# Patient Record
Sex: Female | Born: 1940 | Race: White | Hispanic: No | Smoking: Never smoker
Health system: Southern US, Community
[De-identification: ages and names within clinical notes are randomized; demographics above are authoritative.]

## PROBLEM LIST (undated history)

## (undated) DIAGNOSIS — K224 Dyskinesia of esophagus: Secondary | ICD-10-CM

## (undated) DIAGNOSIS — L4059 Other psoriatic arthropathy: Secondary | ICD-10-CM

## (undated) DIAGNOSIS — Z972 Presence of dental prosthetic device (complete) (partial): Secondary | ICD-10-CM

## (undated) DIAGNOSIS — N189 Chronic kidney disease, unspecified: Secondary | ICD-10-CM

## (undated) DIAGNOSIS — M1990 Unspecified osteoarthritis, unspecified site: Secondary | ICD-10-CM

## (undated) DIAGNOSIS — L409 Psoriasis, unspecified: Secondary | ICD-10-CM

## (undated) DIAGNOSIS — T7840XA Allergy, unspecified, initial encounter: Secondary | ICD-10-CM

## (undated) DIAGNOSIS — M5020 Other cervical disc displacement, unspecified cervical region: Secondary | ICD-10-CM

## (undated) DIAGNOSIS — J45909 Unspecified asthma, uncomplicated: Secondary | ICD-10-CM

## (undated) DIAGNOSIS — F419 Anxiety disorder, unspecified: Secondary | ICD-10-CM

## (undated) DIAGNOSIS — G2581 Restless legs syndrome: Secondary | ICD-10-CM

## (undated) DIAGNOSIS — G709 Myoneural disorder, unspecified: Secondary | ICD-10-CM

## (undated) DIAGNOSIS — K219 Gastro-esophageal reflux disease without esophagitis: Secondary | ICD-10-CM

## (undated) DIAGNOSIS — I10 Essential (primary) hypertension: Secondary | ICD-10-CM

## (undated) HISTORY — DX: Myoneural disorder, unspecified: G70.9

## (undated) HISTORY — DX: Anxiety disorder, unspecified: F41.9

## (undated) HISTORY — PX: TUBAL LIGATION: SHX77

## (undated) HISTORY — DX: Chronic kidney disease, unspecified: N18.9

## (undated) HISTORY — DX: Psoriasis, unspecified: L40.9

## (undated) HISTORY — PX: CATARACT EXTRACTION: SUR2

## (undated) HISTORY — DX: Gastro-esophageal reflux disease without esophagitis: K21.9

## (undated) HISTORY — DX: Other cervical disc displacement, unspecified cervical region: M50.20

## (undated) HISTORY — PX: KNEE SURGERY: SHX244

## (undated) HISTORY — DX: Unspecified osteoarthritis, unspecified site: M19.90

## (undated) HISTORY — PX: ABDOMINAL HYSTERECTOMY: SHX81

## (undated) HISTORY — DX: Unspecified asthma, uncomplicated: J45.909

## (undated) HISTORY — DX: Allergy, unspecified, initial encounter: T78.40XA

## (undated) HISTORY — PX: CHOLECYSTECTOMY: SHX55

## (undated) HISTORY — PX: TONSILLECTOMY: SUR1361

---

## 1998-12-27 ENCOUNTER — Emergency Department (HOSPITAL_COMMUNITY): Admission: EM | Admit: 1998-12-27 | Discharge: 1998-12-27 | Payer: Self-pay | Admitting: Emergency Medicine

## 1998-12-27 ENCOUNTER — Encounter: Payer: Self-pay | Admitting: Emergency Medicine

## 1999-09-04 ENCOUNTER — Other Ambulatory Visit: Admission: RE | Admit: 1999-09-04 | Discharge: 1999-09-04 | Payer: Self-pay | Admitting: Gynecology

## 1999-12-04 ENCOUNTER — Encounter: Payer: Self-pay | Admitting: Surgery

## 1999-12-08 ENCOUNTER — Encounter: Payer: Self-pay | Admitting: Surgery

## 1999-12-08 ENCOUNTER — Ambulatory Visit (HOSPITAL_COMMUNITY): Admission: RE | Admit: 1999-12-08 | Discharge: 1999-12-09 | Payer: Self-pay | Admitting: Surgery

## 1999-12-08 ENCOUNTER — Encounter (INDEPENDENT_AMBULATORY_CARE_PROVIDER_SITE_OTHER): Payer: Self-pay | Admitting: Specialist

## 2000-09-26 ENCOUNTER — Other Ambulatory Visit: Admission: RE | Admit: 2000-09-26 | Discharge: 2000-09-26 | Payer: Self-pay | Admitting: Gynecology

## 2000-10-12 ENCOUNTER — Other Ambulatory Visit: Admission: RE | Admit: 2000-10-12 | Discharge: 2000-10-12 | Payer: Self-pay | Admitting: Gynecology

## 2000-10-12 ENCOUNTER — Encounter (INDEPENDENT_AMBULATORY_CARE_PROVIDER_SITE_OTHER): Payer: Self-pay

## 2000-10-13 ENCOUNTER — Encounter: Payer: Self-pay | Admitting: Gynecology

## 2000-10-13 ENCOUNTER — Ambulatory Visit (HOSPITAL_COMMUNITY): Admission: RE | Admit: 2000-10-13 | Discharge: 2000-10-13 | Payer: Self-pay | Admitting: Gynecology

## 2000-11-10 ENCOUNTER — Encounter: Payer: Self-pay | Admitting: Gynecology

## 2000-11-15 ENCOUNTER — Encounter (INDEPENDENT_AMBULATORY_CARE_PROVIDER_SITE_OTHER): Payer: Self-pay

## 2000-11-15 ENCOUNTER — Inpatient Hospital Stay (HOSPITAL_COMMUNITY): Admission: RE | Admit: 2000-11-15 | Discharge: 2000-11-18 | Payer: Self-pay | Admitting: Gynecology

## 2001-09-28 ENCOUNTER — Other Ambulatory Visit: Admission: RE | Admit: 2001-09-28 | Discharge: 2001-09-28 | Payer: Self-pay | Admitting: Gynecology

## 2002-10-09 ENCOUNTER — Other Ambulatory Visit: Admission: RE | Admit: 2002-10-09 | Discharge: 2002-10-09 | Payer: Self-pay | Admitting: Gynecology

## 2003-01-01 ENCOUNTER — Encounter: Payer: Self-pay | Admitting: Rheumatology

## 2003-01-01 ENCOUNTER — Encounter: Admission: RE | Admit: 2003-01-01 | Discharge: 2003-01-01 | Payer: Self-pay | Admitting: Rheumatology

## 2003-11-11 ENCOUNTER — Other Ambulatory Visit: Admission: RE | Admit: 2003-11-11 | Discharge: 2003-11-11 | Payer: Self-pay | Admitting: Gynecology

## 2004-11-12 ENCOUNTER — Other Ambulatory Visit: Admission: RE | Admit: 2004-11-12 | Discharge: 2004-11-12 | Payer: Self-pay | Admitting: Gynecology

## 2005-11-02 ENCOUNTER — Emergency Department (HOSPITAL_COMMUNITY): Admission: EM | Admit: 2005-11-02 | Discharge: 2005-11-02 | Payer: Self-pay | Admitting: Emergency Medicine

## 2006-02-02 ENCOUNTER — Other Ambulatory Visit: Admission: RE | Admit: 2006-02-02 | Discharge: 2006-02-02 | Payer: Self-pay | Admitting: Gynecology

## 2006-03-18 ENCOUNTER — Ambulatory Visit (HOSPITAL_COMMUNITY): Admission: RE | Admit: 2006-03-18 | Discharge: 2006-03-18 | Payer: Self-pay | Admitting: Rheumatology

## 2006-03-18 ENCOUNTER — Encounter (INDEPENDENT_AMBULATORY_CARE_PROVIDER_SITE_OTHER): Payer: Self-pay | Admitting: Specialist

## 2006-11-14 ENCOUNTER — Other Ambulatory Visit: Admission: RE | Admit: 2006-11-14 | Discharge: 2006-11-14 | Payer: Self-pay | Admitting: Gynecology

## 2007-05-08 ENCOUNTER — Emergency Department (HOSPITAL_COMMUNITY): Admission: EM | Admit: 2007-05-08 | Discharge: 2007-05-08 | Payer: Self-pay | Admitting: Family Medicine

## 2008-05-15 ENCOUNTER — Other Ambulatory Visit: Admission: RE | Admit: 2008-05-15 | Discharge: 2008-05-15 | Payer: Self-pay | Admitting: Gynecology

## 2008-05-18 ENCOUNTER — Emergency Department (HOSPITAL_COMMUNITY): Admission: EM | Admit: 2008-05-18 | Discharge: 2008-05-18 | Payer: Self-pay | Admitting: Family Medicine

## 2008-05-25 ENCOUNTER — Emergency Department (HOSPITAL_COMMUNITY): Admission: EM | Admit: 2008-05-25 | Discharge: 2008-05-25 | Payer: Self-pay | Admitting: Emergency Medicine

## 2009-01-16 ENCOUNTER — Encounter: Admission: RE | Admit: 2009-01-16 | Discharge: 2009-01-16 | Payer: Self-pay | Admitting: Internal Medicine

## 2010-02-17 ENCOUNTER — Encounter: Admission: RE | Admit: 2010-02-17 | Discharge: 2010-02-17 | Payer: Self-pay | Admitting: Internal Medicine

## 2011-04-16 ENCOUNTER — Other Ambulatory Visit (HOSPITAL_COMMUNITY): Payer: Self-pay | Admitting: Rheumatology

## 2011-04-16 DIAGNOSIS — R7989 Other specified abnormal findings of blood chemistry: Secondary | ICD-10-CM

## 2011-04-28 ENCOUNTER — Ambulatory Visit (HOSPITAL_COMMUNITY)
Admission: RE | Admit: 2011-04-28 | Discharge: 2011-04-28 | Disposition: A | Discharge status: Home / Self Care | Payer: Medicare Other | Source: Ambulatory Visit | Attending: Rheumatology | Admitting: Rheumatology

## 2011-04-28 ENCOUNTER — Other Ambulatory Visit: Payer: Self-pay | Admitting: Interventional Radiology

## 2011-04-28 DIAGNOSIS — R7989 Other specified abnormal findings of blood chemistry: Secondary | ICD-10-CM

## 2011-04-28 DIAGNOSIS — L4050 Arthropathic psoriasis, unspecified: Secondary | ICD-10-CM | POA: Insufficient documentation

## 2011-04-28 DIAGNOSIS — Z79899 Other long term (current) drug therapy: Secondary | ICD-10-CM | POA: Insufficient documentation

## 2011-04-28 DIAGNOSIS — K769 Liver disease, unspecified: Secondary | ICD-10-CM | POA: Insufficient documentation

## 2011-04-28 LAB — CBC
HCT: 38.5 % (ref 36.0–46.0)
Hemoglobin: 13.6 g/dL (ref 12.0–15.0)
RDW: 13.5 % (ref 11.5–15.5)
WBC: 7.5 10*3/uL (ref 4.0–10.5)

## 2011-04-28 LAB — PROTIME-INR
INR: 0.92 (ref 0.00–1.49)
Prothrombin Time: 12.6 seconds (ref 11.6–15.2)

## 2011-04-28 LAB — APTT: aPTT: 26 seconds (ref 24–37)

## 2011-05-14 NOTE — Op Note (Signed)
[facility]  Patient:    Heidi Davenport, Heidi Davenport                      MRN: [id number] Proc. Date: 11/15/00 Adm. Date:  [id number] Attending:  Douglass Rivers                           Operative Report  PREOPERATIVE DIAGNOSES: 1. Postmenopausal bleeding. 2. Endocervical polyp. 3. Right ovarian cyst with excrescence.  POSTOPERATIVE DIAGNOSES: 1. Postmenopausal bleeding. 2. Endocervical polyp. 3. Right ovarian cyst with excrescence. 4. Benign ovarian cyst.  PROCEDURE:  Total abdominal hysterectomy, bilateral salpingo-oophorectomy, pelvic washings, and a frozen section.  SURGEON:  Douglass Rivers, M.D.  ASSISTANT:  Gaetano Hawthorne. Lily Peer, M.D.  ANESTHESIA:  General.  IV FLUIDS:  1700 cc lactated Ringers.  ESTIMATED BLOOD LOSS:  Less than 100 cc.  URINE OUTPUT:  125 cc.  FINDINGS:  Normal uterus, left ovary, tube with evidence of previous tubal ligation.  The right ovary had a small, firm cyst that was benign by frozen. There was a cervical polyp.  Pathology was uterus, tubes, ovaries, and cervix.  COMPLICATIONS:  None.  DESCRIPTION OF PROCEDURE:  The patient was taken to the operating room and placed in the supine position.  General anesthesia was induced and prepped and draped in the usual sterile fashion.  A Pfannenstiel skin incision was made with the scalpel and carried through the underlying layer, fascia with the second.  With electrocautery, the fascia was scored in the midline and incision was extended laterally with Mayo scissors.  The inferior aspect of the fascial incision was grasped with Kochers.  Underlying rectus muscles were dissected off by blunt and sharp dissection.  In a similar fashion, the superior aspect of the fascial incision was grasped with Kochers; the underlying rectus muscles were dissected off.  The rectus muscles were separated in the midline.  The peritoneum was identified and entered bluntly. The peritoneal incision was  then extended superiorly and inferiorly with good visualization of the underlying bowel and bladder.  The pelvic washings were then obtained from the pelvis and gutters and well be sent for permanent section with heparin.  The pelvis was then inspected, and the ovarian cyst was palpated.  The OConnor-OSullivan retractor was placed in the incision.  The bowels were packed away with moist lap packs x 4.  The cornua were grasped with Kelly clamps.  The round ligament was transected and suture ligated with 0 Vicryl bilaterally.  The anterior leaf of the broad ligament was incised, and the bladder flap was created sharply.  The posterior leaf was also incised.  The ureters were identified bilaterally.  The infundibulopelvic ligament was transected and suture ligated.  The right ovary was sharply dissected off and sent out for frozen section.  The uterine vessels were then skeletonized, clamped, and doubly suture ligated with 0 Vicryl.  The cardinal ligaments and the uterosacral ligaments were also taken down sharply, transected and suture ligated with 0 Vicryl, all with careful attention to the bladder anteriorly.  The angles were cross-clamped.  The caliber of the vagina was narrow, and two Heaneys completely obliterated the vaginal opening.  The angles were transfixed to the ipsilateral cardinal ligaments bilaterally. There was no bleeding noted at the remainder of the cuff which was essentially closed after the two angle stitches were placed.  The pelvis was irrigated with copious amounts of warm saline.  Inspection underneath the  bladder, the round ligament, and infundibulopelvic ligaments confirmed hemostasis.  The retractor and lap packs were removed.  The bowel was noted to be pushing through the incision; therefore, the peritoneum was identified and closed in a running fashion with 0 Vicryl.  The muscles were inspected and noted to be hemostatic.  The fascia was closed with 0 Vicryl  starting at the apex and moving towards the midline bilaterally.  The subcutaneous was irrigated; the skin closed with staples.  The patient tolerated the procedure well.  Sponge, needle and instrument counts correct x 2.  She was extubated in the OR and transferred to the PACU in stable condition. DD:  11/15/00 TD:  11/16/00 Job: [id number]

## 2011-05-14 NOTE — Discharge Summary (Signed)
[facility]  Patient:    Heidi Davenport, Heidi Davenport                      MRN: [id number] Adm. Date:  [id number] Disc. Date: [id number] Attending:  Douglass Rivers                           Discharge Summary  PRINCIPAL DIAGNOSIS:  Postmenopausal bleeding --  ovarian cyst with excrescence in a postmenopausal woman.  PRINCIPAL PROCEDURE:  Total abdominal hysterectomy and bilateral salpingo-oophorectomy with a frozen section.  HISTORY OF PRESENT ILLNESS:  Please refer to the dictated H&P, but briefly, patient is a 70 year old G3, P3 who had complained of postmenopausal bleeding on the CombiPatch.  She was noted during her exam to have a polypoid mass in her cervix.  She presented back for a sonohysterogram in order to delineate the polypoid mass and during which time she was noted to have a 9 x 9 x 10-mm cyst on the right ovary that was seen with an excrescence in the inferior portion of the lumen.  There was no evidence of fluid in the cul-de-sac. CA125 was normal.  CT scan was negative.  HOSPITAL COURSE:  The patient presented on the afternoon of November 15, 2000 and underwent total abdominal hysterectomy and bilateral salpingo-oophorectomy, pelvic washings and a frozen section under general anesthesia.  The operative findings were normal uterus, left ovary and bilateral salpingectomies.  The right ovary had a small cyst.  The final pathology came back consistent with a benign serous cystadenoma, focal adenomyosis, benign endometrial polyp, benign proliferative endometrium.  The patient was extubated in the OR and transferred to the PACU and then up to the postoperative floor in due fashion.  Her postoperative course was unremarkable.  Her potassium had been low preoperatively and had been corrected.  It was rechecked postoperatively and remained normal at 3.6.  Her postoperative hemoglobin and hematocrit were 11.8 and 33.2 with platelets of 215,000.  By  postoperative day #2, the patient had passed gas and was tolerating a regular diet, she was ambulating without difficulty and was without complaints.  Pain was well-controlled.  She was discharged home on postop day #3.  Her exam was unremarkable.  Her staples were removed and Steri-Strips were placed and her incision was noted to be intact.  DISCHARGE MEDICATIONS:  She was discharged home with her preoperative medications.  She will be switched to an Estraderm patch from CombiPatch.  She has been given prescriptions for Tylox preoperatively and will use them as needed.  FOLLOWUP:  She will follow up in the office in two weeks. DD:  11/18/00 TD:  11/19/00 Job: [id number]

## 2011-07-16 ENCOUNTER — Emergency Department (HOSPITAL_COMMUNITY): Payer: Medicare Other

## 2011-07-16 ENCOUNTER — Ambulatory Visit
Admission: RE | Admit: 2011-07-16 | Discharge: 2011-07-16 | Disposition: A | Discharge status: Home / Self Care | Payer: Medicare Other | Source: Ambulatory Visit | Attending: Rheumatology | Admitting: Rheumatology

## 2011-07-16 ENCOUNTER — Emergency Department (HOSPITAL_COMMUNITY)
Admission: EM | Admit: 2011-07-16 | Discharge: 2011-07-17 | Disposition: A | Discharge status: Home / Self Care | Payer: Medicare Other | Attending: Emergency Medicine | Admitting: Emergency Medicine

## 2011-07-16 ENCOUNTER — Other Ambulatory Visit: Payer: Self-pay | Admitting: Rheumatology

## 2011-07-16 DIAGNOSIS — M549 Dorsalgia, unspecified: Secondary | ICD-10-CM

## 2011-07-16 DIAGNOSIS — R0609 Other forms of dyspnea: Secondary | ICD-10-CM | POA: Insufficient documentation

## 2011-07-16 DIAGNOSIS — R0989 Other specified symptoms and signs involving the circulatory and respiratory systems: Secondary | ICD-10-CM | POA: Insufficient documentation

## 2011-07-16 DIAGNOSIS — R0602 Shortness of breath: Secondary | ICD-10-CM | POA: Insufficient documentation

## 2011-07-16 DIAGNOSIS — R0789 Other chest pain: Secondary | ICD-10-CM | POA: Insufficient documentation

## 2011-07-16 DIAGNOSIS — R7309 Other abnormal glucose: Secondary | ICD-10-CM | POA: Insufficient documentation

## 2011-07-16 DIAGNOSIS — I10 Essential (primary) hypertension: Secondary | ICD-10-CM | POA: Insufficient documentation

## 2011-07-16 DIAGNOSIS — K219 Gastro-esophageal reflux disease without esophagitis: Secondary | ICD-10-CM | POA: Insufficient documentation

## 2011-07-16 HISTORY — DX: Essential (primary) hypertension: I10

## 2011-07-16 LAB — DIFFERENTIAL
Basophils Absolute: 0.1 10*3/uL (ref 0.0–0.1)
Basophils Relative: 1 % (ref 0–1)
Eosinophils Relative: 2 % (ref 0–5)
Lymphocytes Relative: 28 % (ref 12–46)
Monocytes Absolute: 1.1 10*3/uL — ABNORMAL HIGH (ref 0.1–1.0)

## 2011-07-16 LAB — POCT I-STAT, CHEM 8
Calcium, Ion: 1.14 mmol/L (ref 1.12–1.32)
Chloride: 102 mEq/L (ref 96–112)
HCT: 40 % (ref 36.0–46.0)
Hemoglobin: 13.6 g/dL (ref 12.0–15.0)
Potassium: 3.7 mEq/L (ref 3.5–5.1)

## 2011-07-16 LAB — CBC
HCT: 38 % (ref 36.0–46.0)
MCHC: 35.5 g/dL (ref 30.0–36.0)
Platelets: 245 10*3/uL (ref 150–400)
RDW: 13.2 % (ref 11.5–15.5)
WBC: 9.9 10*3/uL (ref 4.0–10.5)

## 2011-07-16 LAB — TROPONIN I: Troponin I: <0.3 ng/mL (ref <0.30)

## 2011-07-17 ENCOUNTER — Encounter (HOSPITAL_COMMUNITY): Payer: Self-pay | Admitting: Radiology

## 2011-07-17 MED ORDER — IOHEXOL 300 MG/ML  SOLN
100.0000 mL | Freq: Once | INTRAMUSCULAR | Status: AC | PRN
  Administered 2011-07-16: 100 mL via INTRAVENOUS

## 2012-04-20 ENCOUNTER — Other Ambulatory Visit: Payer: Self-pay | Admitting: Internal Medicine

## 2012-04-20 DIAGNOSIS — Z1231 Encounter for screening mammogram for malignant neoplasm of breast: Secondary | ICD-10-CM

## 2012-05-01 ENCOUNTER — Ambulatory Visit
Admission: RE | Admit: 2012-05-01 | Discharge: 2012-05-01 | Disposition: A | Discharge status: Home / Self Care | Payer: Medicare Other | Source: Ambulatory Visit | Attending: Internal Medicine | Admitting: Internal Medicine

## 2012-05-01 DIAGNOSIS — Z1231 Encounter for screening mammogram for malignant neoplasm of breast: Secondary | ICD-10-CM

## 2013-07-09 ENCOUNTER — Other Ambulatory Visit: Payer: Self-pay

## 2013-07-09 DIAGNOSIS — Z1231 Encounter for screening mammogram for malignant neoplasm of breast: Secondary | ICD-10-CM

## 2013-08-01 ENCOUNTER — Ambulatory Visit
Admission: RE | Admit: 2013-08-01 | Discharge: 2013-08-01 | Disposition: A | Discharge status: Home / Self Care | Payer: Medicare Other | Source: Ambulatory Visit

## 2013-08-01 DIAGNOSIS — Z1231 Encounter for screening mammogram for malignant neoplasm of breast: Secondary | ICD-10-CM

## 2014-06-27 ENCOUNTER — Other Ambulatory Visit: Payer: Self-pay | Admitting: Rheumatology

## 2014-06-27 DIAGNOSIS — M545 Low back pain, unspecified: Secondary | ICD-10-CM

## 2014-07-04 ENCOUNTER — Ambulatory Visit
Admission: RE | Admit: 2014-07-04 | Discharge: 2014-07-04 | Disposition: A | Discharge status: Home / Self Care | Payer: Medicare Other | Source: Ambulatory Visit | Attending: Rheumatology | Admitting: Rheumatology

## 2014-07-04 DIAGNOSIS — M545 Low back pain, unspecified: Secondary | ICD-10-CM

## 2014-07-09 ENCOUNTER — Other Ambulatory Visit: Payer: Self-pay | Admitting: Rheumatology

## 2014-07-09 DIAGNOSIS — M5416 Radiculopathy, lumbar region: Secondary | ICD-10-CM

## 2014-07-09 DIAGNOSIS — M5126 Other intervertebral disc displacement, lumbar region: Secondary | ICD-10-CM

## 2014-07-11 ENCOUNTER — Other Ambulatory Visit: Payer: Self-pay | Admitting: Rheumatology

## 2014-07-11 ENCOUNTER — Ambulatory Visit
Admission: RE | Admit: 2014-07-11 | Discharge: 2014-07-11 | Disposition: A | Discharge status: Home / Self Care | Payer: Medicare Other | Source: Ambulatory Visit | Attending: Rheumatology | Admitting: Rheumatology

## 2014-07-11 DIAGNOSIS — M5416 Radiculopathy, lumbar region: Secondary | ICD-10-CM

## 2014-07-11 DIAGNOSIS — M5126 Other intervertebral disc displacement, lumbar region: Secondary | ICD-10-CM

## 2014-07-11 MED ORDER — IOHEXOL 180 MG/ML  SOLN
1.0000 mL | Freq: Once | INTRAMUSCULAR | Status: AC | PRN
  Administered 2014-07-11: 1 mL via INTRAVENOUS

## 2014-07-11 MED ORDER — METHYLPREDNISOLONE ACETATE 40 MG/ML INJ SUSP (RADIOLOG
120.0000 mg | Freq: Once | INTRAMUSCULAR | Status: AC
  Administered 2014-07-11: 120 mg via EPIDURAL

## 2014-07-11 NOTE — Discharge Instructions (Signed)

## 2014-09-26 ENCOUNTER — Encounter: Payer: Self-pay | Admitting: Gastroenterology

## 2014-11-12 ENCOUNTER — Ambulatory Visit (AMBULATORY_SURGERY_CENTER): Payer: Self-pay | Admitting: *Deleted

## 2014-11-12 VITALS — Ht 66.0 in | Wt 184.0 lb

## 2014-11-12 DIAGNOSIS — Z1211 Encounter for screening for malignant neoplasm of colon: Secondary | ICD-10-CM

## 2014-11-12 MED ORDER — NA SULFATE-K SULFATE-MG SULF 17.5-3.13-1.6 GM/177ML PO SOLN: ORAL | Status: DC

## 2014-11-12 NOTE — Progress Notes (Signed)
No egg or soy allergy  No anesthesia or intubation problems per pt  No diet medications taken  Registered in EMMI   

## 2014-11-15 ENCOUNTER — Encounter: Payer: Self-pay | Admitting: Gastroenterology

## 2014-11-26 ENCOUNTER — Encounter: Payer: Self-pay | Admitting: Gastroenterology

## 2014-11-26 ENCOUNTER — Ambulatory Visit (AMBULATORY_SURGERY_CENTER): Payer: Medicare Other | Admitting: Gastroenterology

## 2014-11-26 VITALS — BP 138/66 | HR 55 | Temp 98.1°F | Resp 23 | Ht 66.0 in | Wt 184.0 lb

## 2014-11-26 DIAGNOSIS — K648 Other hemorrhoids: Secondary | ICD-10-CM

## 2014-11-26 DIAGNOSIS — Z1211 Encounter for screening for malignant neoplasm of colon: Secondary | ICD-10-CM

## 2014-11-26 MED ORDER — SODIUM CHLORIDE 0.9 % IV SOLN: 500.0000 mL | INTRAVENOUS | Status: DC

## 2014-11-26 NOTE — Patient Instructions (Signed)

## 2014-11-26 NOTE — Op Note (Signed)
[facility] [address] [place], [id number]   COLONOSCOPY PROCEDURE REPORT  PATIENT: Heidi Davenport, Heidi Davenport  MR#: [id number] BIRTHDATE: 09-08-1941 , 72  yrs. old GENDER: female ENDOSCOPIST: Louis Meckelobert D Kaplan, MD REFERRED BY:W.  Buren Kosouglas Shaw, M.D. PROCEDURE DATE:  11/26/2014 PROCEDURE:   Colonoscopy, diagnostic First Screening Colonoscopy - Avg.  risk and is 50 yrs.  old or older Yes.  Prior Negative Screening - Now for repeat screening. N/A  History of Adenoma - Now for follow-up colonoscopy & has been > or = to 3 yrs.  N/A  Polyps Removed Today? No.  Recommend repeat exam, <10 yrs? No. ASA CLASS:   Class II INDICATIONS:first colonoscopy. MEDICATIONS: Monitored anesthesia care and Propofol 250 mg IV  DESCRIPTION OF PROCEDURE:   After the risks benefits and alternatives of the procedure were thoroughly explained, informed consent was obtained.  The digital rectal exam revealed no abnormalities of the rectum.   The LB [id number]  endoscope was introduced through the anus and advanced to the cecum, which was identified by both the appendix and ileocecal valve. No adverse events experienced.   The quality of the prep was excellent using Suprep  The instrument was then slowly withdrawn as the colon was fully examined.      COLON FINDINGS: Internal hemorrhoids were found.   The examination was otherwise normal.  Retroflexed views revealed no abnormalities. The time to cecum=5 minutes 49 seconds.  Withdrawal time=6 minutes 34 seconds.  The scope was withdrawn and the procedure completed. COMPLICATIONS: There were no immediate complications.  ENDOSCOPIC IMPRESSION: 1.   Internal hemorrhoids 2.   The examination was otherwise normal  RECOMMENDATIONS: Given your age, you will not need another colonoscopy for colon cancer screening or polyp surveillance.  These types of tests usually stop around the age 580.  eSigned:  Louis Meckelobert D Kaplan, MD 11/26/2014 11:44  AM   cc:

## 2014-11-27 ENCOUNTER — Telehealth: Payer: Self-pay

## 2014-11-27 NOTE — Telephone Encounter (Signed)
  Follow up Call-  Call back number 11/26/2014  Post procedure Call Back phone  # [phone]  Permission to leave phone message Yes     Patient questions:  Do you have a fever, pain , or abdominal swelling? No. Pain Score  0 *  Have you tolerated food without any problems? Yes.    Have you been able to return to your normal activities? Yes.    Do you have any questions about your discharge instructions: Diet   No. Medications  No. Follow up visit  No.  Do you have questions or concerns about your Care? No.  Actions: * If pain score is 4 or above: No action needed, pain <4.  The pt was still asleep.  I spoke with Romeo AppleBen, her broth-in-law.  He said she was fine last night.  I asked Romeo AppleBen to please let Mrs. Ruppel know I called.  To call us back if any questions or concerns. maw

## 2015-03-26 ENCOUNTER — Ambulatory Visit (INDEPENDENT_AMBULATORY_CARE_PROVIDER_SITE_OTHER): Payer: PPO | Admitting: Licensed Clinical Social Worker

## 2015-03-26 DIAGNOSIS — F324 Major depressive disorder, single episode, in partial remission: Secondary | ICD-10-CM

## 2015-04-11 ENCOUNTER — Ambulatory Visit: Payer: PPO | Admitting: Licensed Clinical Social Worker

## 2015-04-16 ENCOUNTER — Ambulatory Visit: Payer: PPO | Admitting: Licensed Clinical Social Worker

## 2016-02-19 DIAGNOSIS — R799 Abnormal finding of blood chemistry, unspecified: Secondary | ICD-10-CM | POA: Diagnosis not present

## 2016-02-19 DIAGNOSIS — L4050 Arthropathic psoriasis, unspecified: Secondary | ICD-10-CM | POA: Diagnosis not present

## 2016-02-19 DIAGNOSIS — F419 Anxiety disorder, unspecified: Secondary | ICD-10-CM | POA: Diagnosis not present

## 2016-02-19 DIAGNOSIS — Z79899 Other long term (current) drug therapy: Secondary | ICD-10-CM | POA: Diagnosis not present

## 2016-04-08 DIAGNOSIS — L4050 Arthropathic psoriasis, unspecified: Secondary | ICD-10-CM | POA: Diagnosis not present

## 2016-04-08 DIAGNOSIS — Z79899 Other long term (current) drug therapy: Secondary | ICD-10-CM | POA: Diagnosis not present

## 2016-06-24 DIAGNOSIS — R7989 Other specified abnormal findings of blood chemistry: Secondary | ICD-10-CM | POA: Diagnosis not present

## 2016-06-24 DIAGNOSIS — L4050 Arthropathic psoriasis, unspecified: Secondary | ICD-10-CM | POA: Diagnosis not present

## 2016-07-23 DIAGNOSIS — L4050 Arthropathic psoriasis, unspecified: Secondary | ICD-10-CM | POA: Diagnosis not present

## 2016-07-23 DIAGNOSIS — R7989 Other specified abnormal findings of blood chemistry: Secondary | ICD-10-CM | POA: Diagnosis not present

## 2016-10-28 DIAGNOSIS — L4050 Arthropathic psoriasis, unspecified: Secondary | ICD-10-CM | POA: Diagnosis not present

## 2016-10-28 DIAGNOSIS — Z79899 Other long term (current) drug therapy: Secondary | ICD-10-CM | POA: Diagnosis not present

## 2016-10-28 DIAGNOSIS — M79642 Pain in left hand: Secondary | ICD-10-CM | POA: Diagnosis not present

## 2016-10-28 DIAGNOSIS — M79641 Pain in right hand: Secondary | ICD-10-CM | POA: Diagnosis not present

## 2016-10-28 DIAGNOSIS — R7989 Other specified abnormal findings of blood chemistry: Secondary | ICD-10-CM | POA: Diagnosis not present

## 2016-11-01 DIAGNOSIS — E784 Other hyperlipidemia: Secondary | ICD-10-CM | POA: Diagnosis not present

## 2016-11-01 DIAGNOSIS — N390 Urinary tract infection, site not specified: Secondary | ICD-10-CM | POA: Diagnosis not present

## 2016-11-01 DIAGNOSIS — M859 Disorder of bone density and structure, unspecified: Secondary | ICD-10-CM | POA: Diagnosis not present

## 2016-11-01 DIAGNOSIS — R7301 Impaired fasting glucose: Secondary | ICD-10-CM | POA: Diagnosis not present

## 2016-11-01 DIAGNOSIS — R8299 Other abnormal findings in urine: Secondary | ICD-10-CM | POA: Diagnosis not present

## 2016-11-01 DIAGNOSIS — I10 Essential (primary) hypertension: Secondary | ICD-10-CM | POA: Diagnosis not present

## 2016-11-23 DIAGNOSIS — Z79899 Other long term (current) drug therapy: Secondary | ICD-10-CM | POA: Diagnosis not present

## 2016-11-23 DIAGNOSIS — L4050 Arthropathic psoriasis, unspecified: Secondary | ICD-10-CM | POA: Diagnosis not present

## 2016-11-23 DIAGNOSIS — R7989 Other specified abnormal findings of blood chemistry: Secondary | ICD-10-CM | POA: Diagnosis not present

## 2016-11-29 DIAGNOSIS — F3341 Major depressive disorder, recurrent, in partial remission: Secondary | ICD-10-CM | POA: Diagnosis not present

## 2016-11-29 DIAGNOSIS — L4059 Other psoriatic arthropathy: Secondary | ICD-10-CM | POA: Diagnosis not present

## 2016-11-29 DIAGNOSIS — I10 Essential (primary) hypertension: Secondary | ICD-10-CM | POA: Diagnosis not present

## 2016-11-29 DIAGNOSIS — D848 Other specified immunodeficiencies: Secondary | ICD-10-CM | POA: Diagnosis not present

## 2016-11-29 DIAGNOSIS — R7301 Impaired fasting glucose: Secondary | ICD-10-CM | POA: Diagnosis not present

## 2016-11-29 DIAGNOSIS — Z23 Encounter for immunization: Secondary | ICD-10-CM | POA: Diagnosis not present

## 2016-11-29 DIAGNOSIS — R740 Nonspecific elevation of levels of transaminase and lactic acid dehydrogenase [LDH]: Secondary | ICD-10-CM | POA: Diagnosis not present

## 2016-11-29 DIAGNOSIS — Z1389 Encounter for screening for other disorder: Secondary | ICD-10-CM | POA: Diagnosis not present

## 2016-11-29 DIAGNOSIS — Z6827 Body mass index (BMI) 27.0-27.9, adult: Secondary | ICD-10-CM | POA: Diagnosis not present

## 2016-11-29 DIAGNOSIS — E784 Other hyperlipidemia: Secondary | ICD-10-CM | POA: Diagnosis not present

## 2016-11-29 DIAGNOSIS — E781 Pure hyperglyceridemia: Secondary | ICD-10-CM | POA: Diagnosis not present

## 2016-11-29 DIAGNOSIS — Z0000 Encounter for general adult medical examination without abnormal findings: Secondary | ICD-10-CM | POA: Diagnosis not present

## 2016-12-09 DIAGNOSIS — Z1212 Encounter for screening for malignant neoplasm of rectum: Secondary | ICD-10-CM | POA: Diagnosis not present

## 2017-02-04 DIAGNOSIS — L4050 Arthropathic psoriasis, unspecified: Secondary | ICD-10-CM | POA: Diagnosis not present

## 2017-02-04 DIAGNOSIS — R7989 Other specified abnormal findings of blood chemistry: Secondary | ICD-10-CM | POA: Diagnosis not present

## 2017-02-04 DIAGNOSIS — Z79899 Other long term (current) drug therapy: Secondary | ICD-10-CM | POA: Diagnosis not present

## 2017-02-04 DIAGNOSIS — M79642 Pain in left hand: Secondary | ICD-10-CM | POA: Diagnosis not present

## 2017-02-04 DIAGNOSIS — M79641 Pain in right hand: Secondary | ICD-10-CM | POA: Diagnosis not present

## 2017-03-29 DIAGNOSIS — R7989 Other specified abnormal findings of blood chemistry: Secondary | ICD-10-CM | POA: Diagnosis not present

## 2017-03-29 DIAGNOSIS — L4050 Arthropathic psoriasis, unspecified: Secondary | ICD-10-CM | POA: Diagnosis not present

## 2017-03-29 DIAGNOSIS — Z79899 Other long term (current) drug therapy: Secondary | ICD-10-CM | POA: Diagnosis not present

## 2017-07-29 DIAGNOSIS — I10 Essential (primary) hypertension: Secondary | ICD-10-CM | POA: Diagnosis not present

## 2017-08-12 DIAGNOSIS — F419 Anxiety disorder, unspecified: Secondary | ICD-10-CM | POA: Diagnosis not present

## 2017-08-12 DIAGNOSIS — F339 Major depressive disorder, recurrent, unspecified: Secondary | ICD-10-CM | POA: Diagnosis not present

## 2017-08-12 DIAGNOSIS — Z6826 Body mass index (BMI) 26.0-26.9, adult: Secondary | ICD-10-CM | POA: Diagnosis not present

## 2017-08-18 DIAGNOSIS — F339 Major depressive disorder, recurrent, unspecified: Secondary | ICD-10-CM | POA: Diagnosis not present

## 2017-08-18 DIAGNOSIS — F419 Anxiety disorder, unspecified: Secondary | ICD-10-CM | POA: Diagnosis not present

## 2017-08-30 DIAGNOSIS — M5136 Other intervertebral disc degeneration, lumbar region: Secondary | ICD-10-CM | POA: Diagnosis not present

## 2017-08-30 DIAGNOSIS — M150 Primary generalized (osteo)arthritis: Secondary | ICD-10-CM | POA: Diagnosis not present

## 2017-08-30 DIAGNOSIS — L4059 Other psoriatic arthropathy: Secondary | ICD-10-CM | POA: Diagnosis not present

## 2017-08-30 DIAGNOSIS — M810 Age-related osteoporosis without current pathological fracture: Secondary | ICD-10-CM | POA: Diagnosis not present

## 2017-09-26 DIAGNOSIS — F339 Major depressive disorder, recurrent, unspecified: Secondary | ICD-10-CM | POA: Diagnosis not present

## 2017-09-26 DIAGNOSIS — Z6827 Body mass index (BMI) 27.0-27.9, adult: Secondary | ICD-10-CM | POA: Diagnosis not present

## 2017-09-26 DIAGNOSIS — F419 Anxiety disorder, unspecified: Secondary | ICD-10-CM | POA: Diagnosis not present

## 2017-09-26 DIAGNOSIS — M25511 Pain in right shoulder: Secondary | ICD-10-CM | POA: Diagnosis not present

## 2017-10-18 ENCOUNTER — Ambulatory Visit (INDEPENDENT_AMBULATORY_CARE_PROVIDER_SITE_OTHER): Payer: PPO | Admitting: Licensed Clinical Social Worker

## 2017-10-18 ENCOUNTER — Encounter (HOSPITAL_COMMUNITY): Payer: Self-pay | Admitting: Licensed Clinical Social Worker

## 2017-10-18 ENCOUNTER — Encounter (INDEPENDENT_AMBULATORY_CARE_PROVIDER_SITE_OTHER): Payer: Self-pay

## 2017-10-18 DIAGNOSIS — F411 Generalized anxiety disorder: Secondary | ICD-10-CM

## 2017-10-18 DIAGNOSIS — F330 Major depressive disorder, recurrent, mild: Secondary | ICD-10-CM | POA: Diagnosis not present

## 2017-10-18 NOTE — Progress Notes (Signed)
Comprehensive Clinical Assessment (CCA) Note  10/18/2017 Heidi Davenport [id number]  Visit Diagnosis:      ICD-10-CM   1. MDD (major depressive disorder), recurrent episode, mild (HCC) F33.0   2. Generalized anxiety disorder F41.1       CCA Part One  Part One has been completed on paper by the patient.  (See scanned document in Chart Review)  CCA Part Two A  Intake/Chief Complaint:  CCA Intake With Chief Complaint CCA Part Two Date: 10/18/17 CCA Part Two Time: 1107 Chief Complaint/Presenting Problem: anxiety and depression, husband passed away 06-Nov-2011- diabetes, brown recluse spider bite. Had a hard time expressing sorrow. 2 years ago, friend died.  Patients Currently Reported Symptoms/Problems: 3 children- middle child (52) bad relationships, always needed assistance. Yaniyah was living with daughter and paying all of her bills, very depressed. bad relationship now with daughter very upsetting for [place]. recently moved into a nice condo.  Collateral Involvement: 3 sisters, 1 brother, children, grandchildren Individual's Strengths: always the peacemaker, gets along with everyone Individual's Preferences: spending time with family, reading, sewing, squaredancing Individual's Abilities: sewing, reading, word-searches, puzzles, church Type of Services Patient Feels Are Needed: individual therapy, medication management  Mental Health Symptoms Depression:  Depression: Hopelessness, Tearfulness, Change in energy/activity, Difficulty Concentrating (change in medication and living condition has helped)  Mania:  Mania: N/A  Anxiety:   Anxiety: Worrying, Fatigue  Psychosis:  Psychosis: N/A  Trauma:  Trauma: N/A  Obsessions:  Obsessions: N/A  Compulsions:  Compulsions: N/A  Inattention:  Inattention: N/A  Hyperactivity/Impulsivity:  Hyperactivity/Impulsivity: Fidgets with hands/feet  Oppositional/Defiant Behaviors:  Oppositional/Defiant Behaviors: N/A  Borderline Personality:  Emotional  Irregularity: N/A  Other Mood/Personality Symptoms:   NA   Mental Status Exam Appearance and self-care  Stature:  Stature: Average  Weight:  Weight: Average weight  Clothing:  Clothing: Neat/clean  Grooming:  Grooming: Normal  Cosmetic use:  Cosmetic Use: Age appropriate  Posture/gait:  Posture/Gait: Normal  Motor activity:  Motor Activity: Not Remarkable  Sensorium  Attention:  Attention: Normal  Concentration:  Concentration: Normal  Orientation:  Orientation: X5  Recall/memory:  Recall/Memory: Normal  Affect and Mood  Affect:  Affect: Appropriate  Mood:  Mood: Euthymic  Relating  Eye contact:  Eye Contact: Normal  Facial expression:  Facial Expression: Responsive  Attitude toward examiner:  Attitude Toward Examiner: Cooperative  Thought and Language  Speech flow: Speech Flow: Normal  Thought content:  Thought Content: Appropriate to mood and circumstances  Preoccupation:   NA  Hallucinations:   NA  Organization:   Goal-directed  Company secretary of Knowledge:  Fund of Knowledge: Average  Intelligence:  Intelligence: Above Average  Abstraction:  Abstraction: Normal  Judgement:  Judgement: Normal, Common-sensical  Reality Testing:  Reality Testing: Adequate  Insight:  Insight: Good  Decision Making:  Decision Making: Normal  Social Functioning  Social Maturity:  Social Maturity: Responsible  Social Judgement:  Social Judgement: Normal  Stress  Stressors:  Stressors: Grief/losses  Coping Ability:  Coping Ability: Resilient  Skill Deficits:   NA  Supports:   NA   Family and Psychosocial History: Family history Marital status: Widowed Widowed, when?: 2012 Are you sexually active?: No What is your sexual orientation?: heterosexual Has your sexual activity been affected by drugs, alcohol, medication, or emotional stress?: no Does patient have children?: Yes How many children?: 3 How is patient's relationship with their children?: very good relationship  with sons, but problems getting along with daughter.   Childhood History:  Childhood History By whom was/is the patient raised?: Both parents Additional childhood history information: we had a wonderful childhood. Sick a lot with asthma as a child.  Description of patient's relationship with caregiver when they were a child: wonderful parents, mother was a Neurosurgeonseamstress, taught me how to sew Patient's description of current relationship with people who raised him/her: parents are deceased How were you disciplined when you got in trouble as a child/adolescent?: send to our room, cut out activities Does patient have siblings?: Yes Number of Siblings: 4 Description of patient's current relationship with siblings: good relationship with sibings and their mates Did patient suffer any verbal/emotional/physical/sexual abuse as a child?: No Did patient suffer from severe childhood neglect?: No Has patient ever been sexually abused/assaulted/raped as an adolescent or adult?: No Was the patient ever a victim of a crime or a disaster?: No Witnessed domestic violence?: No Has patient been effected by domestic violence as an adult?: No  CCA Part Two B  Employment/Work Situation: Employment / Work Psychologist, occupationalituation Employment situation: Retired Psychologist, clinicalatient's job has been impacted by current illness: No What is the longest time patient has a held a job?: 8 years Where was the patient employed at that time?: [employer]-customer service, check processing, managing Has patient ever been in the Eli Lilly and Companymilitary?: No Are There Guns or Other Weapons in Your Home?: No  Education: Education Did Garment/textile technologistYou Graduate From [employer]?: Yes Did Theme park managerYou Attend College?: No Did Designer, television/film setYou Attend Graduate School?: No Did You Have Any Scientist, research (life sciences)pecial Interests In School?: made good grades, [employer], drama, played basketball, track, singing, art Did You Have An Individualized Education Program (IIEP): No Did You Have Any  Difficulty At [employer] EnergySchool?: No  Religion: Religion/Spirituality Are You A Religious Person?: Yes What is Your Religious Affiliation?: Baptist How Might This Affect Treatment?: it won't  Leisure/Recreation: Leisure / Recreation Leisure and Hobbies: art, reading, singing  Exercise/Diet: Exercise/Diet Do You Exercise?: Yes What Type of Exercise Do You Do?: Run/Walk How Many Times a Week Do You Exercise?: Daily Have You Gained or Lost A Significant Amount of Weight in the Past Six Months?: Yes-Lost Number of Pounds Lost?: 10 Do You Follow a Special Diet?: No Do You Have Any Trouble Sleeping?: No  CCA Part Two C  Alcohol/Drug Use: Alcohol / Drug Use Pain Medications: see MAR Prescriptions: see MAR Over the Counter: see MAR History of alcohol / drug use?: No history of alcohol / drug abuse                      CCA Part Three  ASAM's:  Six Dimensions of Multidimensional Assessment  Dimension 1:  Acute Intoxication and/or Withdrawal Potential:     Dimension 2:  Biomedical Conditions and Complications:     Dimension 3:  Emotional, Behavioral, or Cognitive Conditions and Complications:     Dimension 4:  Readiness to Change:     Dimension 5:  Relapse, Continued use, or Continued Problem Potential:     Dimension 6:  Recovery/Living Environment:      Substance use Disorder (SUD)    Social Function:  Social Functioning Social Maturity: Responsible Social Judgement: Normal  Stress:  Stress Stressors: Grief/losses Coping Ability: Resilient Patient Takes Medications The Way The Doctor Instructed?: Yes Priority Risk: Low Acuity  Risk Assessment- Self-Harm Potential: Risk Assessment For Self-Harm Potential Thoughts of Self-Harm: No current thoughts Method: No plan Availability of Means: No access/NA  Risk Assessment -Dangerous to Others Potential: Risk Assessment  For Dangerous to Others Potential Method: No Plan Availability of Means: No access or NA Intent: Vague  intent or NA Notification Required: No need or identified person  DSM5 Diagnoses: There are no active problems to display for this patient.   Patient Centered Plan: Patient is on the following Treatment Plan(s):  Anxiety and Depression  Recommendations for Services/Supports/Treatments: Recommendations for Services/Supports/Treatments Recommendations For Services/Supports/Treatments: Individual Therapy, Medication Management  Treatment Plan Summary:    Referrals to Alternative Service(s): Referred to Alternative Service(s):   Place:   Date:   Time:    Referred to Alternative Service(s):   Place:   Date:   Time:    Referred to Alternative Service(s):   Place:   Date:   Time:    Referred to Alternative Service(s):   Place:   Date:   Time:     Veneda Melter, LCSW

## 2017-10-18 NOTE — Progress Notes (Signed)
   THERAPIST PROGRESS NOTE  Session Time: 11:00am-12:00pm  Participation Level: Active  Behavioral Response: Well GroomedAlertEuthymic  Type of Therapy: Individual Therapy  Treatment Goals addressed: Anxiety and Diagnosis: Major Depressive Disorder, recurrent episode, mild  Interventions: CBT and Motivational Interviewing  Summary: Heidi Davenport is a 76 y.o. female who presents with Major Depressive Disorder, recurrent episode, mild and Generalized Anxiety Disorder.   Suicidal/Homicidal: Nowithout intent/plan  Therapist Response: Heidi Davenport responded well in CCA. Heidi Davenport identified improvement in her depressed mood due to moving out of her daughter's home and having her own condominium to live in. Heidi Davenport reports she has been taking medication as directed and anti-depressant medication has been helpful with mood. Heidi Davenport reports she is social, spends time with family and friends, and has a very positive support system.   Plan: Return again in 4 weeks.  Diagnosis: Axis I: Major Depressive Disorder, Recurrent episode, Mild Generalized Anxiety Disorder      Veneda MelterJessica R Schlosberg, LCSW 10/18/2017

## 2017-10-24 DIAGNOSIS — F418 Other specified anxiety disorders: Secondary | ICD-10-CM | POA: Diagnosis not present

## 2017-10-24 DIAGNOSIS — R05 Cough: Secondary | ICD-10-CM | POA: Diagnosis not present

## 2017-10-24 DIAGNOSIS — D848 Other specified immunodeficiencies: Secondary | ICD-10-CM | POA: Diagnosis not present

## 2017-10-24 DIAGNOSIS — Z6826 Body mass index (BMI) 26.0-26.9, adult: Secondary | ICD-10-CM | POA: Diagnosis not present

## 2017-10-24 DIAGNOSIS — F338 Other recurrent depressive disorders: Secondary | ICD-10-CM | POA: Diagnosis not present

## 2017-10-24 DIAGNOSIS — J069 Acute upper respiratory infection, unspecified: Secondary | ICD-10-CM | POA: Diagnosis not present

## 2017-11-21 ENCOUNTER — Ambulatory Visit (HOSPITAL_COMMUNITY): Payer: Self-pay | Admitting: Licensed Clinical Social Worker

## 2017-11-24 DIAGNOSIS — E7849 Other hyperlipidemia: Secondary | ICD-10-CM | POA: Diagnosis not present

## 2017-11-24 DIAGNOSIS — M859 Disorder of bone density and structure, unspecified: Secondary | ICD-10-CM | POA: Diagnosis not present

## 2017-11-24 DIAGNOSIS — I10 Essential (primary) hypertension: Secondary | ICD-10-CM | POA: Diagnosis not present

## 2017-11-24 DIAGNOSIS — R7301 Impaired fasting glucose: Secondary | ICD-10-CM | POA: Diagnosis not present

## 2017-12-01 DIAGNOSIS — E7849 Other hyperlipidemia: Secondary | ICD-10-CM | POA: Diagnosis not present

## 2017-12-01 DIAGNOSIS — Z6827 Body mass index (BMI) 27.0-27.9, adult: Secondary | ICD-10-CM | POA: Diagnosis not present

## 2017-12-01 DIAGNOSIS — Z23 Encounter for immunization: Secondary | ICD-10-CM | POA: Diagnosis not present

## 2017-12-01 DIAGNOSIS — E781 Pure hyperglyceridemia: Secondary | ICD-10-CM | POA: Diagnosis not present

## 2017-12-01 DIAGNOSIS — M8580 Other specified disorders of bone density and structure, unspecified site: Secondary | ICD-10-CM | POA: Diagnosis not present

## 2017-12-01 DIAGNOSIS — D849 Immunodeficiency, unspecified: Secondary | ICD-10-CM | POA: Diagnosis not present

## 2017-12-01 DIAGNOSIS — F3341 Major depressive disorder, recurrent, in partial remission: Secondary | ICD-10-CM | POA: Diagnosis not present

## 2017-12-01 DIAGNOSIS — K219 Gastro-esophageal reflux disease without esophagitis: Secondary | ICD-10-CM | POA: Diagnosis not present

## 2017-12-01 DIAGNOSIS — I10 Essential (primary) hypertension: Secondary | ICD-10-CM | POA: Diagnosis not present

## 2017-12-01 DIAGNOSIS — Z0000 Encounter for general adult medical examination without abnormal findings: Secondary | ICD-10-CM | POA: Diagnosis not present

## 2017-12-01 DIAGNOSIS — R7301 Impaired fasting glucose: Secondary | ICD-10-CM | POA: Diagnosis not present

## 2017-12-01 DIAGNOSIS — L4050 Arthropathic psoriasis, unspecified: Secondary | ICD-10-CM | POA: Diagnosis not present

## 2017-12-02 ENCOUNTER — Other Ambulatory Visit: Payer: Self-pay | Admitting: Internal Medicine

## 2017-12-02 DIAGNOSIS — Z139 Encounter for screening, unspecified: Secondary | ICD-10-CM

## 2017-12-09 DIAGNOSIS — Z1212 Encounter for screening for malignant neoplasm of rectum: Secondary | ICD-10-CM | POA: Diagnosis not present

## 2017-12-23 DIAGNOSIS — L4059 Other psoriatic arthropathy: Secondary | ICD-10-CM | POA: Diagnosis not present

## 2018-01-02 ENCOUNTER — Ambulatory Visit (HOSPITAL_COMMUNITY): Payer: Self-pay | Admitting: Licensed Clinical Social Worker

## 2018-01-02 DIAGNOSIS — Z6827 Body mass index (BMI) 27.0-27.9, adult: Secondary | ICD-10-CM | POA: Diagnosis not present

## 2018-01-02 DIAGNOSIS — F3341 Major depressive disorder, recurrent, in partial remission: Secondary | ICD-10-CM | POA: Diagnosis not present

## 2018-01-03 ENCOUNTER — Ambulatory Visit: Payer: Self-pay

## 2018-01-20 ENCOUNTER — Ambulatory Visit
Admission: RE | Admit: 2018-01-20 | Discharge: 2018-01-20 | Disposition: A | Discharge status: Home / Self Care | Payer: PPO | Source: Ambulatory Visit | Attending: Internal Medicine | Admitting: Internal Medicine

## 2018-01-20 DIAGNOSIS — Z1231 Encounter for screening mammogram for malignant neoplasm of breast: Secondary | ICD-10-CM | POA: Diagnosis not present

## 2018-01-20 DIAGNOSIS — Z139 Encounter for screening, unspecified: Secondary | ICD-10-CM

## 2018-02-10 DIAGNOSIS — H353131 Nonexudative age-related macular degeneration, bilateral, early dry stage: Secondary | ICD-10-CM | POA: Diagnosis not present

## 2018-02-10 DIAGNOSIS — H26493 Other secondary cataract, bilateral: Secondary | ICD-10-CM | POA: Diagnosis not present

## 2018-03-06 DIAGNOSIS — M5136 Other intervertebral disc degeneration, lumbar region: Secondary | ICD-10-CM | POA: Diagnosis not present

## 2018-03-06 DIAGNOSIS — E663 Overweight: Secondary | ICD-10-CM | POA: Diagnosis not present

## 2018-03-06 DIAGNOSIS — L400 Psoriasis vulgaris: Secondary | ICD-10-CM | POA: Diagnosis not present

## 2018-03-06 DIAGNOSIS — Z6826 Body mass index (BMI) 26.0-26.9, adult: Secondary | ICD-10-CM | POA: Diagnosis not present

## 2018-03-06 DIAGNOSIS — M150 Primary generalized (osteo)arthritis: Secondary | ICD-10-CM | POA: Diagnosis not present

## 2018-03-06 DIAGNOSIS — M810 Age-related osteoporosis without current pathological fracture: Secondary | ICD-10-CM | POA: Diagnosis not present

## 2018-03-06 DIAGNOSIS — L4059 Other psoriatic arthropathy: Secondary | ICD-10-CM | POA: Diagnosis not present

## 2018-04-06 DIAGNOSIS — H26492 Other secondary cataract, left eye: Secondary | ICD-10-CM | POA: Diagnosis not present

## 2018-04-07 DIAGNOSIS — J309 Allergic rhinitis, unspecified: Secondary | ICD-10-CM | POA: Diagnosis not present

## 2018-04-07 DIAGNOSIS — F3341 Major depressive disorder, recurrent, in partial remission: Secondary | ICD-10-CM | POA: Diagnosis not present

## 2018-04-27 DIAGNOSIS — F418 Other specified anxiety disorders: Secondary | ICD-10-CM | POA: Diagnosis not present

## 2018-05-10 DIAGNOSIS — M79674 Pain in right toe(s): Secondary | ICD-10-CM | POA: Diagnosis not present

## 2018-05-10 DIAGNOSIS — M79675 Pain in left toe(s): Secondary | ICD-10-CM | POA: Diagnosis not present

## 2018-05-10 DIAGNOSIS — M25775 Osteophyte, left foot: Secondary | ICD-10-CM | POA: Diagnosis not present

## 2018-05-10 DIAGNOSIS — L600 Ingrowing nail: Secondary | ICD-10-CM | POA: Diagnosis not present

## 2018-05-10 DIAGNOSIS — M25774 Osteophyte, right foot: Secondary | ICD-10-CM | POA: Diagnosis not present

## 2018-05-14 ENCOUNTER — Emergency Department (HOSPITAL_COMMUNITY)
Admission: EM | Admit: 2018-05-14 | Discharge: 2018-05-14 | Disposition: A | Discharge status: Home / Self Care | Payer: PPO | Attending: Emergency Medicine | Admitting: Emergency Medicine

## 2018-05-14 ENCOUNTER — Other Ambulatory Visit: Payer: Self-pay

## 2018-05-14 DIAGNOSIS — I129 Hypertensive chronic kidney disease with stage 1 through stage 4 chronic kidney disease, or unspecified chronic kidney disease: Secondary | ICD-10-CM | POA: Insufficient documentation

## 2018-05-14 DIAGNOSIS — L089 Local infection of the skin and subcutaneous tissue, unspecified: Secondary | ICD-10-CM

## 2018-05-14 DIAGNOSIS — Z79899 Other long term (current) drug therapy: Secondary | ICD-10-CM | POA: Insufficient documentation

## 2018-05-14 DIAGNOSIS — J45909 Unspecified asthma, uncomplicated: Secondary | ICD-10-CM | POA: Diagnosis not present

## 2018-05-14 DIAGNOSIS — N189 Chronic kidney disease, unspecified: Secondary | ICD-10-CM | POA: Insufficient documentation

## 2018-05-14 DIAGNOSIS — R350 Frequency of micturition: Secondary | ICD-10-CM | POA: Insufficient documentation

## 2018-05-14 LAB — URINALYSIS, ROUTINE W REFLEX MICROSCOPIC
BILIRUBIN URINE: NEGATIVE
Bacteria, UA: NONE SEEN
GLUCOSE, UA: NEGATIVE mg/dL
Hgb urine dipstick: NEGATIVE
Ketones, ur: NEGATIVE mg/dL
NITRITE: NEGATIVE
Protein, ur: NEGATIVE mg/dL
SPECIFIC GRAVITY, URINE: 1.004 — AB (ref 1.005–1.030)
pH: 6 (ref 5.0–8.0)

## 2018-05-14 MED ORDER — CEPHALEXIN 500 MG PO CAPS: 500.0000 mg | ORAL_CAPSULE | Freq: Four times a day (QID) | ORAL | 0 refills | Status: DC

## 2018-05-14 MED ORDER — BACITRACIN ZINC 500 UNIT/GM EX OINT
TOPICAL_OINTMENT | Freq: Two times a day (BID) | CUTANEOUS | Status: DC
  Administered 2018-05-14: 1 via TOPICAL
  Filled 2018-05-14: qty 0.9

## 2018-05-14 MED ORDER — CEPHALEXIN 500 MG PO CAPS
500.0000 mg | ORAL_CAPSULE | Freq: Once | ORAL | Status: AC
  Administered 2018-05-14: 500 mg via ORAL
  Filled 2018-05-14: qty 1

## 2018-05-14 MED ORDER — HYDROGEN PEROXIDE 3 % EX SOLN
CUTANEOUS | Status: AC
  Administered 2018-05-14: 1
  Filled 2018-05-14: qty 473

## 2018-05-14 NOTE — ED Provider Notes (Signed)
[facility]-EMERGENCY DEPT Provider Note   CSN: [id number] Arrival date & time: 05/14/18  2028     History   Chief Complaint Chief Complaint  Patient presents with  . Toe Injury    HPI Heidi Davenport is a 77 y.o. female.  Patient c/o mild drainage at site of prior left great toe nail removal from this past week. Mild erythema to toe. No fever/chills. Also mild urinary frequency. No dysuria. No abd or flank pain. No vomiting.   The history is provided by the patient.    Past Medical History:  Diagnosis Date  . Allergy   . Anxiety   . Arthritis    psoriatic arthrits  . Asthma   . Chronic kidney disease    hx UTIs  . GERD (gastroesophageal reflux disease)   . Hypertension    no meds taken  . Psoriasis   . Slipped cervical disc     There are no active problems to display for this patient.   Past Surgical History:  Procedure Laterality Date  . ABDOMINAL HYSTERECTOMY    . CATARACT EXTRACTION     both eye  . CHOLECYSTECTOMY    . KNEE SURGERY     right knee- scar tissue removed  . TONSILLECTOMY     1953  . TUBAL LIGATION     1977     OB History   None      Home Medications    Prior to Admission medications   Medication Sig Start Date End Date Taking? Authorizing Provider  albuterol (PROVENTIL HFA;VENTOLIN HFA) 108 (90 Base) MCG/ACT inhaler Inhale 2 puffs into the lungs every 6 (six) hours as needed for wheezing or shortness of breath.   Yes [provider]  calcium carbonate (OSCAL) 1500 (600 Ca) MG TABS tablet Take 1 tablet by mouth daily.   Yes [provider]  cholecalciferol (VITAMIN D) 1000 units tablet Take 2,000 Units by mouth daily.   Yes [provider]  fexofenadine (ALLEGRA) 180 MG tablet Take 180 mg by mouth daily.   Yes [provider]  fluticasone (FLONASE) 50 MCG/ACT nasal spray Place 1 spray into both nostrils daily as needed for allergies or rhinitis.   Yes [provider]   folic acid (FOLVITE) 800 MCG tablet Take 800 mcg by mouth daily.   Yes [provider]  ibuprofen (ADVIL,MOTRIN) 200 MG tablet Take 200 mg by mouth every 6 (six) hours as needed for moderate pain.   Yes [provider]  lisinopril (PRINIVIL,ZESTRIL) 20 MG tablet Take 20 mg by mouth daily. 10/28/14  Yes [provider]  methotrexate (RHEUMATREX) 2.5 MG tablet Take 5-7.5 mg by mouth as directed. Caution:Chemotherapy. Protect from light. Takes 3 tablets on Friday, 2 tablets on Saturday    Yes [provider]  Multiple Vitamin (MULTIVITAMIN WITH MINERALS) TABS tablet Take 1 tablet by mouth daily.   Yes [provider]  Multiple Vitamins-Minerals (PRESERVISION AREDS 2+MULTI VIT PO) Take 1 tablet by mouth 2 (two) times daily.   Yes [provider]  potassium chloride SA (K-DUR,KLOR-CON) 20 MEQ tablet Take 20 mEq by mouth daily.   Yes [provider]  ranitidine (ZANTAC) 150 MG tablet Take 150 mg by mouth 2 (two) times daily.   Yes [provider]  sertraline (ZOLOFT) 100 MG tablet Take 100 mg by mouth daily.   Yes [provider]    Family History Family History  Problem Relation Age of Onset  .  Colon cancer Neg Hx   . Esophageal cancer Neg Hx   . Rectal cancer Neg Hx   . Stomach cancer Neg Hx     Social History Social History   Tobacco Use  . Smoking status: Never Smoker  . Smokeless tobacco: Never Used  Substance Use Topics  . Alcohol use: No  . Drug use: No     Allergies   Bee venom; Codeine; and Penicillins   Review of Systems Review of Systems  Constitutional: Negative for chills and fever.  Gastrointestinal: Negative for abdominal pain.  Genitourinary: Negative for flank pain.  Skin:       Recent left great toe nail removal       Physical Exam Updated Vital Signs BP (!) 146/96 (BP Location: Left Arm)   Pulse 66   Temp 98.3 F (36.8 C) (Oral)   Resp 18   Ht 1.676 m ( )   Wt 74.4  kg (164 lb)   SpO2 95%   BMI 26.47 kg/m   Physical Exam  Constitutional: She appears well-developed and well-nourished. No distress.  Eyes: Conjunctivae are normal. No scleral icterus.  Neck: Neck supple. No tracheal deviation present.  Cardiovascular: Normal rate and intact distal pulses.  Pulmonary/Chest: Effort normal. No respiratory distress.  Abdominal: Normal appearance. She exhibits no distension. There is no tenderness.  Genitourinary:  Genitourinary Comments: No cva/flank tenderness.   Musculoskeletal: She exhibits no edema.  V mild erythema to left great toe around nail bed. Scant purulent drainage from lateral edge great toe.   Neurological: She is alert.  Skin: Skin is warm and dry. No rash noted. She is not diaphoretic.  Psychiatric: She has a normal mood and affect.  Nursing note and vitals reviewed.    ED Treatments / Results  Labs (all labs ordered are listed, but only abnormal results are displayed) Results for orders placed or performed during the hospital encounter of 05/14/18  Urinalysis, Routine w reflex microscopic  Result Value Ref Range   Color, Urine STRAW (A) YELLOW   APPearance CLEAR CLEAR   Specific Gravity, Urine 1.004 (L) 1.005 - 1.030   pH 6.0 5.0 - 8.0   Glucose, UA NEGATIVE NEGATIVE mg/dL   Hgb urine dipstick NEGATIVE NEGATIVE   Bilirubin Urine NEGATIVE NEGATIVE   Ketones, ur NEGATIVE NEGATIVE mg/dL   Protein, ur NEGATIVE NEGATIVE mg/dL   Nitrite NEGATIVE NEGATIVE   Leukocytes, UA TRACE (A) NEGATIVE   RBC / HPF 0-5 0 - 5 RBC/hpf   WBC, UA 0-5 0 - 5 WBC/hpf   Bacteria, UA NONE SEEN NONE SEEN   Squamous Epithelial / LPF 0-5 0 - 5    EKG None  Radiology No results found.  Procedures Procedures (including critical care time)  Medications Ordered in ED Medications  bacitracin ointment (has no administration in time range)  cephALEXin (KEFLEX) capsule 500 mg (has no administration in time range)  hydrogen peroxide 3 % external  solution (has no administration in time range)     Initial Impression / Assessment and Plan / ED Course  I have reviewed the triage vital signs and the nursing notes.  Pertinent labs & imaging results that were available during my care of the patient were reviewed by me and considered in my medical decision making (see chart for details).  Wound cleaned, bacitracin and dressing.   Keflex po.  ua sent.   Reviewed nursing notes and prior charts for additional history.   Labs reviewed - ua negative.  rx for home.   Final Clinical Impressions(s) / ED Diagnoses   Final diagnoses:  None    ED Discharge Orders    None       Cathren Laine, MD 05/14/18 2148

## 2018-05-14 NOTE — ED Triage Notes (Signed)
Per Pt: Pt reports she recently had a toenail removed from her left great toe. Pt reports she has been having chills and has had urinary frequency.  Pt's great toe has tight looking, shiny skin. Pt reports she is not having a great amount of pain to the area but is concerned about infection

## 2018-05-14 NOTE — Discharge Instructions (Addendum)
It was our pleasure to provide your ER care today - we hope that you feel better.  Take antibiotic as prescribed.   Keep area very clean. You may apply a thin coat of antibiotic ointment/bacitracin to area.   Return to ER if worse, spreading redness, fevers, severe pain, other concern.

## 2018-06-06 DIAGNOSIS — L4059 Other psoriatic arthropathy: Secondary | ICD-10-CM | POA: Diagnosis not present

## 2018-06-07 DIAGNOSIS — F418 Other specified anxiety disorders: Secondary | ICD-10-CM | POA: Diagnosis not present

## 2018-06-09 DIAGNOSIS — H26491 Other secondary cataract, right eye: Secondary | ICD-10-CM | POA: Diagnosis not present

## 2018-07-24 DIAGNOSIS — E663 Overweight: Secondary | ICD-10-CM | POA: Diagnosis not present

## 2018-07-24 DIAGNOSIS — L4059 Other psoriatic arthropathy: Secondary | ICD-10-CM | POA: Diagnosis not present

## 2018-07-24 DIAGNOSIS — Z6826 Body mass index (BMI) 26.0-26.9, adult: Secondary | ICD-10-CM | POA: Diagnosis not present

## 2018-07-24 DIAGNOSIS — M5136 Other intervertebral disc degeneration, lumbar region: Secondary | ICD-10-CM | POA: Diagnosis not present

## 2018-07-24 DIAGNOSIS — M150 Primary generalized (osteo)arthritis: Secondary | ICD-10-CM | POA: Diagnosis not present

## 2018-07-24 DIAGNOSIS — M810 Age-related osteoporosis without current pathological fracture: Secondary | ICD-10-CM | POA: Diagnosis not present

## 2018-07-24 DIAGNOSIS — L400 Psoriasis vulgaris: Secondary | ICD-10-CM | POA: Diagnosis not present

## 2018-08-07 DIAGNOSIS — M25551 Pain in right hip: Secondary | ICD-10-CM | POA: Diagnosis not present

## 2018-08-07 DIAGNOSIS — W19XXXA Unspecified fall, initial encounter: Secondary | ICD-10-CM | POA: Diagnosis not present

## 2018-08-07 DIAGNOSIS — F3341 Major depressive disorder, recurrent, in partial remission: Secondary | ICD-10-CM | POA: Diagnosis not present

## 2018-08-07 DIAGNOSIS — R2681 Unsteadiness on feet: Secondary | ICD-10-CM | POA: Diagnosis not present

## 2018-08-10 ENCOUNTER — Ambulatory Visit: Payer: PPO

## 2018-08-10 ENCOUNTER — Ambulatory Visit: Payer: PPO | Admitting: Podiatry

## 2018-08-10 ENCOUNTER — Encounter: Payer: Self-pay | Admitting: Podiatry

## 2018-08-10 VITALS — BP 128/69 | HR 63 | Resp 16

## 2018-08-10 DIAGNOSIS — M779 Enthesopathy, unspecified: Secondary | ICD-10-CM | POA: Diagnosis not present

## 2018-08-10 DIAGNOSIS — M778 Other enthesopathies, not elsewhere classified: Secondary | ICD-10-CM

## 2018-08-10 NOTE — Progress Notes (Signed)
Subjective:  Patient ID: Heidi Davenport, female    DOB: 01/29/1941,  MRN: [id number] HPI Chief Complaint  Patient presents with  . Toe Pain    Hallux right - fell x 3 days ago, hurt back and hit toe, bruised, PCP xrayed said no fx, little tender still but better than it was, feet were hurting severely until Dr. Dierdre ForthBeekman gave injection in back-feet feel much better now, Rheumatologist still wanted her seen today though  . New Patient (Initial Visit)    77 y.o. female presents with the above complaint.   ROS: Denies fever chills nausea vomiting muscle aches pains calf pain back pain chest pain shortness of breath.  Past Medical History:  Diagnosis Date  . Allergy   . Anxiety   . Arthritis    psoriatic arthrits  . Asthma   . Chronic kidney disease    hx UTIs  . GERD (gastroesophageal reflux disease)   . Hypertension    no meds taken  . Psoriasis   . Slipped cervical disc    Past Surgical History:  Procedure Laterality Date  . ABDOMINAL HYSTERECTOMY    . CATARACT EXTRACTION     both eye  . CHOLECYSTECTOMY    . KNEE SURGERY     right knee- scar tissue removed  . TONSILLECTOMY     1953  . TUBAL LIGATION     1977    Current Outpatient Medications:  .  albuterol (PROVENTIL HFA;VENTOLIN HFA) 108 (90 Base) MCG/ACT inhaler, Inhale 2 puffs into the lungs every 6 (six) hours as needed for wheezing or shortness of breath., Disp: , Rfl:  .  calcium carbonate (OSCAL) 1500 (600 Ca) MG TABS tablet, Take 1 tablet by mouth daily., Disp: , Rfl:  .  cholecalciferol (VITAMIN D) 1000 units tablet, Take 2,000 Units by mouth daily., Disp: , Rfl:  .  fexofenadine (ALLEGRA) 180 MG tablet, Take 180 mg by mouth daily., Disp: , Rfl:  .  fluticasone (FLONASE) 50 MCG/ACT nasal spray, Place 1 spray into both nostrils daily as needed for allergies or rhinitis., Disp: , Rfl:  .  folic acid (FOLVITE) 800 MCG tablet, Take 800 mcg by mouth daily., Disp: , Rfl:  .  ibuprofen (ADVIL,MOTRIN) 200 MG tablet,  Take 200 mg by mouth every 6 (six) hours as needed for moderate pain., Disp: , Rfl:  .  lisinopril (PRINIVIL,ZESTRIL) 20 MG tablet, Take 20 mg by mouth daily., Disp: , Rfl: 0 .  methotrexate (RHEUMATREX) 2.5 MG tablet, Take 5-7.5 mg by mouth as directed. Caution:Chemotherapy. Protect from light. Takes 3 tablets on Friday, 2 tablets on Saturday , Disp: , Rfl:  .  Multiple Vitamin (MULTIVITAMIN WITH MINERALS) TABS tablet, Take 1 tablet by mouth daily., Disp: , Rfl:  .  Multiple Vitamins-Minerals (PRESERVISION AREDS 2+MULTI VIT PO), Take 1 tablet by mouth 2 (two) times daily., Disp: , Rfl:  .  potassium chloride SA (K-DUR,KLOR-CON) 20 MEQ tablet, Take 20 mEq by mouth daily., Disp: , Rfl:  .  ranitidine (ZANTAC) 150 MG tablet, Take 150 mg by mouth 2 (two) times daily., Disp: , Rfl:  .  sertraline (ZOLOFT) 100 MG tablet, Take 100 mg by mouth daily., Disp: , Rfl:   Allergies  Allergen Reactions  . Bee Venom Swelling and Other (See Comments)    dizziness  . Codeine Nausea Only and Other (See Comments)    Headaches  . Penicillins Nausea Only and Other (See Comments)    Headaches- pt unsure if she if truly  allergic Has patient had a PCN reaction causing immediate rash, facial/tongue/throat swelling, SOB or lightheadedness with hypotension: no Has patient had a PCN reaction causing severe rash involving mucus membranes or skin necrosis: no Has patient had a PCN reaction that required hospitalization: no Has patient had a PCN reaction occurring within the last 10 years: no If all of the above answers are "NO", then may proceed with Cephalosporin use.   Review of Systems Objective:   Vitals:   08/10/18 1106  BP: 128/69  Pulse: 63  Resp: 16    General: Well developed, nourished, in no acute distress, alert and oriented x3   Dermatological: Skin is warm, dry and supple bilateral. Nails x 10 are well maintained; remaining integument appears unremarkable at this time. There are no open sores, no  preulcerative lesions, no rash or signs of infection present.  Ecchymosis to the medial aspect proximal phalanx hallux right.  Mildly tender on palpation.  Sharp incurvated nail margin along the tibial border of the hallux right.  Vascular: Dorsalis Pedis artery and Posterior Tibial artery pedal pulses are 2/4 bilateral with immedate capillary fill time. Pedal hair growth present. No varicosities and no lower extremity edema present bilateral.   Neruologic: Grossly intact via light touch bilateral. Vibratory intact via tuning fork bilateral. Protective threshold with Semmes Wienstein monofilament intact to all pedal sites bilateral. Patellar and Achilles deep tendon reflexes 2+ bilateral. No Babinski or clonus noted bilateral.   Musculoskeletal: No gross boney pedal deformities bilateral. No pain, crepitus, or limitation noted with foot and ankle range of motion bilateral. Muscular strength 5/5 in all groups tested bilateral.  Gait: Unassisted, Nonantalgic.    Radiographs:  None taken  Assessment & Plan:   Assessment: Psoriatic arthritis.  Contusion toe hallux right.  Ingrown toenail hallux right.  Plan: Patient will follow-up with us in the future if once this has become more painful.     Max T. KingsburgHyatt, North DakotaDPM

## 2018-08-10 NOTE — Addendum Note (Signed)
Addended by: Lottie RaterPREVETTE, ASHLEY E on: 08/10/2018 01:48 PM   Modules accepted: Orders

## 2018-09-06 DIAGNOSIS — M810 Age-related osteoporosis without current pathological fracture: Secondary | ICD-10-CM | POA: Diagnosis not present

## 2018-09-06 DIAGNOSIS — L4059 Other psoriatic arthropathy: Secondary | ICD-10-CM | POA: Diagnosis not present

## 2018-09-06 DIAGNOSIS — L400 Psoriasis vulgaris: Secondary | ICD-10-CM | POA: Diagnosis not present

## 2018-09-06 DIAGNOSIS — M150 Primary generalized (osteo)arthritis: Secondary | ICD-10-CM | POA: Diagnosis not present

## 2018-09-06 DIAGNOSIS — M5136 Other intervertebral disc degeneration, lumbar region: Secondary | ICD-10-CM | POA: Diagnosis not present

## 2018-09-21 ENCOUNTER — Ambulatory Visit: Payer: PPO | Admitting: Podiatry

## 2018-12-01 DIAGNOSIS — M859 Disorder of bone density and structure, unspecified: Secondary | ICD-10-CM | POA: Diagnosis not present

## 2018-12-01 DIAGNOSIS — R82998 Other abnormal findings in urine: Secondary | ICD-10-CM | POA: Diagnosis not present

## 2018-12-01 DIAGNOSIS — R7301 Impaired fasting glucose: Secondary | ICD-10-CM | POA: Diagnosis not present

## 2018-12-01 DIAGNOSIS — I10 Essential (primary) hypertension: Secondary | ICD-10-CM | POA: Diagnosis not present

## 2018-12-01 DIAGNOSIS — E7849 Other hyperlipidemia: Secondary | ICD-10-CM | POA: Diagnosis not present

## 2018-12-06 DIAGNOSIS — M150 Primary generalized (osteo)arthritis: Secondary | ICD-10-CM | POA: Diagnosis not present

## 2018-12-06 DIAGNOSIS — E663 Overweight: Secondary | ICD-10-CM | POA: Diagnosis not present

## 2018-12-06 DIAGNOSIS — L4059 Other psoriatic arthropathy: Secondary | ICD-10-CM | POA: Diagnosis not present

## 2018-12-06 DIAGNOSIS — M810 Age-related osteoporosis without current pathological fracture: Secondary | ICD-10-CM | POA: Diagnosis not present

## 2018-12-06 DIAGNOSIS — Z6828 Body mass index (BMI) 28.0-28.9, adult: Secondary | ICD-10-CM | POA: Diagnosis not present

## 2018-12-06 DIAGNOSIS — L400 Psoriasis vulgaris: Secondary | ICD-10-CM | POA: Diagnosis not present

## 2018-12-06 DIAGNOSIS — M5136 Other intervertebral disc degeneration, lumbar region: Secondary | ICD-10-CM | POA: Diagnosis not present

## 2018-12-08 DIAGNOSIS — M859 Disorder of bone density and structure, unspecified: Secondary | ICD-10-CM | POA: Diagnosis not present

## 2018-12-08 DIAGNOSIS — K219 Gastro-esophageal reflux disease without esophagitis: Secondary | ICD-10-CM | POA: Diagnosis not present

## 2018-12-08 DIAGNOSIS — Z1212 Encounter for screening for malignant neoplasm of rectum: Secondary | ICD-10-CM | POA: Diagnosis not present

## 2018-12-08 DIAGNOSIS — Z0000 Encounter for general adult medical examination without abnormal findings: Secondary | ICD-10-CM | POA: Diagnosis not present

## 2018-12-08 DIAGNOSIS — F3341 Major depressive disorder, recurrent, in partial remission: Secondary | ICD-10-CM | POA: Diagnosis not present

## 2018-12-08 DIAGNOSIS — Z23 Encounter for immunization: Secondary | ICD-10-CM | POA: Diagnosis not present

## 2018-12-08 DIAGNOSIS — Z1389 Encounter for screening for other disorder: Secondary | ICD-10-CM | POA: Diagnosis not present

## 2018-12-08 DIAGNOSIS — E7849 Other hyperlipidemia: Secondary | ICD-10-CM | POA: Diagnosis not present

## 2018-12-08 DIAGNOSIS — R7301 Impaired fasting glucose: Secondary | ICD-10-CM | POA: Diagnosis not present

## 2018-12-08 DIAGNOSIS — E781 Pure hyperglyceridemia: Secondary | ICD-10-CM | POA: Diagnosis not present

## 2018-12-08 DIAGNOSIS — I10 Essential (primary) hypertension: Secondary | ICD-10-CM | POA: Diagnosis not present

## 2018-12-08 DIAGNOSIS — L4050 Arthropathic psoriasis, unspecified: Secondary | ICD-10-CM | POA: Diagnosis not present

## 2018-12-08 DIAGNOSIS — Z6830 Body mass index (BMI) 30.0-30.9, adult: Secondary | ICD-10-CM | POA: Diagnosis not present

## 2018-12-08 DIAGNOSIS — D849 Immunodeficiency, unspecified: Secondary | ICD-10-CM | POA: Diagnosis not present

## 2019-01-08 DIAGNOSIS — I10 Essential (primary) hypertension: Secondary | ICD-10-CM | POA: Diagnosis not present

## 2019-01-08 DIAGNOSIS — Z6829 Body mass index (BMI) 29.0-29.9, adult: Secondary | ICD-10-CM | POA: Diagnosis not present

## 2019-01-08 DIAGNOSIS — J040 Acute laryngitis: Secondary | ICD-10-CM | POA: Diagnosis not present

## 2019-02-05 ENCOUNTER — Other Ambulatory Visit: Payer: Self-pay | Admitting: Internal Medicine

## 2019-02-05 DIAGNOSIS — Z1231 Encounter for screening mammogram for malignant neoplasm of breast: Secondary | ICD-10-CM

## 2019-02-16 ENCOUNTER — Ambulatory Visit: Payer: Self-pay | Admitting: Gastroenterology

## 2019-02-16 ENCOUNTER — Ambulatory Visit: Payer: Self-pay

## 2019-02-21 ENCOUNTER — Encounter: Payer: Self-pay | Admitting: Gastroenterology

## 2019-02-21 ENCOUNTER — Ambulatory Visit: Payer: PPO | Admitting: Gastroenterology

## 2019-02-21 ENCOUNTER — Encounter (INDEPENDENT_AMBULATORY_CARE_PROVIDER_SITE_OTHER): Payer: Self-pay

## 2019-02-21 VITALS — BP 122/80 | HR 80 | Ht 64.75 in | Wt 178.1 lb

## 2019-02-21 DIAGNOSIS — R1013 Epigastric pain: Secondary | ICD-10-CM | POA: Diagnosis not present

## 2019-02-21 DIAGNOSIS — R1310 Dysphagia, unspecified: Secondary | ICD-10-CM

## 2019-02-21 NOTE — Progress Notes (Signed)
Referring Provider: Martha Clan, MD Primary Care Physician:  Martha Clan, MD   Reason for Consultation:  Indigestion, reflux   IMPRESSION:  Dyspepsia/Heartburn ? Dysphagia to meats  Normal screening colonoscopy 2015 Arlyce Dice) Prior cholecystectomy for cholelithiasis Anxiety  Breakthrough dyspepsia/heartburn despite BID Pepcid. Afraid to eat meats due to dysphagia. EGD with possible dilation recommended given the differential of gastric and esophageal etiologies.   Severity of her symptoms seems to be driven by anxiety with a deep-rooted concern for undiagnosed malignancy.  PLAN: Omeprazole 40 mg daily  EGD with possible dilation  I consented the patient at the bedside today discussing the risks, benefits, and alternatives to endoscopic evaluation. In particular, we discussed the risks that include, but are not limited to, reaction to medication, cardiopulmonary compromise, bleeding requiring blood transfusion, aspiration resulting in pneumonia, perforation requiring surgery, lack of diagnosis, severe illness requiring hospitalization, and even death. We reviewed the risk of missed lesion including polyps or even cancer. The patient acknowledges these risks and asks that we proceed.  HPI: Heidi Davenport is a 78 y.o. female self-referred. I have no old medical records available to me at this time. She is followed by Dr. Martha Clan. The history is obtained from the patient. Her niece accompanies her to this appointment. Cholecystectomy for gallstones. History of asthma, arthritis, anxiety and depression.   "Severe reflux" years ago that required esophageal dilation and long-term H2blocker therapy. She was on Zantac until last year. Symptoms had abated until they return several weeks ago with near constant feelings of indigestion and reflux. Associated eructation. Some improvement with dietary changes over the last few days - staying away from raw vegetables. Avoiding meats because of  prior trouble with dysphagia. No odynophagia. No nausea. No change in bowel habits. Weight as recently increased. Dr. Clelia Croft suggested Pepcid instead of Zantac. Some improvement with Pepcid.  No other associated symptoms. No identified exacerbating or relieving features.    The patient had a normal screening colonoscopy with Dr. Arlyce Dice 11/26/2014.  No repeat colonoscopy recommended given her advanced age.  Mother with head and neck cancer in her 49s. No known family history of esophageal cancer, colon cancer, or colon polyps. Non-biologic family member with esophageal cancer has her concerned.      Past Medical History:  Diagnosis Date  . Allergy   . Anxiety   . Arthritis    psoriatic arthrits  . Asthma   . Chronic kidney disease    hx UTIs  . GERD (gastroesophageal reflux disease)   . Hypertension    no meds taken  . Psoriasis   . Slipped cervical disc     Past Surgical History:  Procedure Laterality Date  . ABDOMINAL HYSTERECTOMY    . CATARACT EXTRACTION     both eye  . CHOLECYSTECTOMY    . KNEE SURGERY     right knee- scar tissue removed  . TONSILLECTOMY     1953  . TUBAL LIGATION     1977    Current Outpatient Medications  Medication Sig Dispense Refill  . albuterol (PROVENTIL HFA;VENTOLIN HFA) 108 (90 Base) MCG/ACT inhaler Inhale 2 puffs into the lungs every 6 (six) hours as needed for wheezing or shortness of breath.    . calcium carbonate (OSCAL) 1500 (600 Ca) MG TABS tablet Take 1 tablet by mouth daily.    . cholecalciferol (VITAMIN D) 1000 units tablet Take 2,000 Units by mouth daily.    . famotidine (PEPCID) 10 MG tablet Take 20 mg by  mouth daily.    . fexofenadine (ALLEGRA) 180 MG tablet Take 180 mg by mouth daily.    . fluticasone (FLONASE) 50 MCG/ACT nasal spray Place 1 spray into both nostrils daily as needed for allergies or rhinitis.    . folic acid (FOLVITE) 800 MCG tablet Take 800 mcg by mouth daily.    Marland Kitchen ibuprofen (ADVIL,MOTRIN) 200 MG tablet Take 200  mg by mouth as needed for moderate pain.     Marland Kitchen lisinopril (PRINIVIL,ZESTRIL) 20 MG tablet Take 20 mg by mouth daily.  0  . methotrexate (RHEUMATREX) 2.5 MG tablet Take 5-7.5 mg by mouth as directed. Caution:Chemotherapy. Protect from light. Takes 3 tablets on Friday, 2 tablets on Saturday     . Multiple Vitamin (MULTIVITAMIN WITH MINERALS) TABS tablet Take 1 tablet by mouth daily.    . Multiple Vitamins-Minerals (PRESERVISION AREDS 2+MULTI VIT PO) Take 1 tablet by mouth 2 (two) times daily.    . potassium chloride SA (K-DUR,KLOR-CON) 20 MEQ tablet Take 20 mEq by mouth daily.    . ranitidine (ZANTAC) 150 MG tablet Take 150 mg by mouth 2 (two) times daily.    Marland Kitchen rOPINIRole (REQUIP) 0.5 MG tablet Take 0.5 mg by mouth at bedtime.    . sertraline (ZOLOFT) 100 MG tablet Take 100 mg by mouth daily.     No current facility-administered medications for this visit.     Allergies as of 02/21/2019 - Review Complete 02/21/2019  Allergen Reaction Noted  . Bee venom Swelling and Other (See Comments) 05/14/2018  . Codeine Nausea Only and Other (See Comments) 07/17/2011  . Penicillins Nausea Only and Other (See Comments) 07/17/2011    Family History  Problem Relation Age of Onset  . Colon cancer Neg Hx   . Esophageal cancer Neg Hx   . Rectal cancer Neg Hx   . Stomach cancer Neg Hx     Social History   Socioeconomic History  . Marital status: Married    Spouse name: Not on file  . Number of children: Not on file  . Years of education: Not on file  . Highest education level: Not on file  Occupational History  . Not on file  Social Needs  . Financial resource strain: Not on file  . Food insecurity:    Worry: Not on file    Inability: Not on file  . Transportation needs:    Medical: Not on file    Non-medical: Not on file  Tobacco Use  . Smoking status: Never Smoker  . Smokeless tobacco: Never Used  Substance and Sexual Activity  . Alcohol use: No  . Drug use: No  . Sexual activity:  Not on file  Lifestyle  . Physical activity:    Days per week: Not on file    Minutes per session: Not on file  . Stress: Not on file  Relationships  . Social connections:    Talks on phone: Not on file    Gets together: Not on file    Attends religious service: Not on file    Active member of club or organization: Not on file    Attends meetings of clubs or organizations: Not on file    Relationship status: Not on file  . Intimate partner violence:    Fear of current or ex partner: Not on file    Emotionally abused: Not on file    Physically abused: Not on file    Forced sexual activity: Not on file  Other Topics Concern  .  Not on file  Social History Narrative  . Not on file    Review of Systems: 12 system ROS is negative except as noted above with the exceptions of sinus trouble, vision changes, depression.  Filed Weights   02/21/19 1058  Weight: 178 lb 2 oz (80.8 kg)    Physical Exam: Vital signs were reviewed. General:   Alert, well-nourished, pleasant and cooperative in NAD Head:  Normocephalic and atraumatic. Eyes:  Sclera clear, no icterus.   Conjunctiva pink. Mouth:  No deformity or lesions.   Neck:  Supple; no thyromegaly. Lungs:  Clear throughout to auscultation.   No wheezes.  Heart:  Regular rate and rhythm; no murmurs Abdomen:  Soft, nontender, normal bowel sounds. No rebound or guarding. No hepatosplenomegaly Rectal:  Deferred  Msk:  Symmetrical without gross deformities. Extremities:  No gross deformities or edema. Neurologic:  Alert and  oriented x4;  grossly nonfocal Skin:  No rash or bruise. Psych:  Alert and cooperative. Normal mood and affect.   Donshay Lupinski L. Orvan Falconer, MD, MPH  Gastroenterology 02/22/2019, 4:02 PM

## 2019-02-21 NOTE — Patient Instructions (Addendum)
If you are age 79 or older, your body mass index should be between 23-30. Your Body mass index is 29.87 kg/m. If this is out of the aforementioned range listed, please consider follow up with your Primary Care Provider.  You have been scheduled for an endoscopy. Please follow written instructions given to you at your visit today. If you use inhalers (even only as needed), please bring them with you on the day of your procedure. Your physician has requested that you go to www.startemmi.com and enter the access code given to you at your visit today. This web site gives a general overview about your procedure. However, you should still follow specific instructions given to you by our office regarding your preparation for the procedure.   Thank you for choosing me and [facility].  Dr. Orvan Falconer

## 2019-02-22 ENCOUNTER — Encounter: Payer: Self-pay | Admitting: Gastroenterology

## 2019-02-27 ENCOUNTER — Ambulatory Visit (AMBULATORY_SURGERY_CENTER): Payer: PPO | Admitting: Gastroenterology

## 2019-02-27 ENCOUNTER — Encounter: Payer: Self-pay | Admitting: Gastroenterology

## 2019-02-27 VITALS — BP 141/69 | HR 50 | Temp 97.8°F | Resp 12 | Ht 64.0 in | Wt 178.0 lb

## 2019-02-27 DIAGNOSIS — K2950 Unspecified chronic gastritis without bleeding: Secondary | ICD-10-CM | POA: Diagnosis not present

## 2019-02-27 DIAGNOSIS — J45909 Unspecified asthma, uncomplicated: Secondary | ICD-10-CM | POA: Diagnosis not present

## 2019-02-27 DIAGNOSIS — I10 Essential (primary) hypertension: Secondary | ICD-10-CM | POA: Diagnosis not present

## 2019-02-27 DIAGNOSIS — B9681 Helicobacter pylori [H. pylori] as the cause of diseases classified elsewhere: Secondary | ICD-10-CM

## 2019-02-27 DIAGNOSIS — K2970 Gastritis, unspecified, without bleeding: Secondary | ICD-10-CM | POA: Diagnosis not present

## 2019-02-27 DIAGNOSIS — K219 Gastro-esophageal reflux disease without esophagitis: Secondary | ICD-10-CM | POA: Diagnosis not present

## 2019-02-27 DIAGNOSIS — K209 Esophagitis, unspecified without bleeding: Secondary | ICD-10-CM

## 2019-02-27 DIAGNOSIS — R1013 Epigastric pain: Secondary | ICD-10-CM

## 2019-02-27 DIAGNOSIS — K210 Gastro-esophageal reflux disease with esophagitis: Secondary | ICD-10-CM | POA: Diagnosis not present

## 2019-02-27 DIAGNOSIS — K30 Functional dyspepsia: Secondary | ICD-10-CM | POA: Diagnosis not present

## 2019-02-27 MED ORDER — SODIUM CHLORIDE 0.9 % IV SOLN: 500.0000 mL | Freq: Once | INTRAVENOUS | Status: DC

## 2019-02-27 MED ORDER — OMEPRAZOLE 40 MG PO CPDR: 40.0000 mg | DELAYED_RELEASE_CAPSULE | Freq: Every day | ORAL | 3 refills | Status: DC

## 2019-02-27 NOTE — Progress Notes (Signed)
Report given to PACU, vss 

## 2019-02-27 NOTE — Progress Notes (Signed)
Called to room to assist during endoscopic procedure.  Patient ID and intended procedure confirmed with present staff. Received instructions for my participation in the procedure from the performing physician.  

## 2019-02-27 NOTE — Patient Instructions (Signed)
Handout given for esophagitis.  YOU HAD AN ENDOSCOPIC PROCEDURE TODAY AT THE [facility]:   Refer to the procedure report that was given to you for any specific questions about what was found during the examination.  If the procedure report does not answer your questions, please call your gastroenterologist to clarify.  If you requested that your care partner not be given the details of your procedure findings, then the procedure report has been included in a sealed envelope for you to review at your convenience later.  YOU SHOULD EXPECT: Some feelings of bloating in the abdomen. Passage of more gas than usual.  Walking can help get rid of the air that was put into your GI tract during the procedure and reduce the bloating. If you had a lower endoscopy (such as a colonoscopy or flexible sigmoidoscopy) you may notice spotting of blood in your stool or on the toilet paper. If you underwent a bowel prep for your procedure, you may not have a normal bowel movement for a few days.  Please Note:  You might notice some irritation and congestion in your nose or some drainage.  This is from the oxygen used during your procedure.  There is no need for concern and it should clear up in a day or so.  SYMPTOMS TO REPORT IMMEDIATELY:   Following upper endoscopy (EGD)  Vomiting of blood or coffee ground material  New chest pain or pain under the shoulder blades  Painful or persistently difficult swallowing  New shortness of breath  Fever of 100F or higher  Black, tarry-looking stools  For urgent or emergent issues, a gastroenterologist can be reached at any hour by calling [phone].   DIET:  We do recommend a small meal at first, but then you may proceed to your regular diet.  Drink plenty of fluids but you should avoid alcoholic beverages for 24 hours.  ACTIVITY:  You should plan to take it easy for the rest of today and you should NOT DRIVE or use heavy machinery until tomorrow  (because of the sedation medicines used during the test).    FOLLOW UP: Our staff will call the number listed on your records the next business day following your procedure to check on you and address any questions or concerns that you may have regarding the information given to you following your procedure. If we do not reach you, we will leave a message.  However, if you are feeling well and you are not experiencing any problems, there is no need to return our call.  We will assume that you have returned to your regular daily activities without incident.  If any biopsies were taken you will be contacted by phone or by letter within the next 1-3 weeks.  Please call us at [phone] if you have not heard about the biopsies in 3 weeks.    SIGNATURES/CONFIDENTIALITY: You and/or your care partner have signed paperwork which will be entered into your electronic medical record.  These signatures attest to the fact that that the information above on your After Visit Summary has been reviewed and is understood.  Full responsibility of the confidentiality of this discharge information lies with you and/or your care-partner.

## 2019-02-27 NOTE — Progress Notes (Signed)
Pt's states no medical or surgical changes since previsit or office visit. 

## 2019-02-28 ENCOUNTER — Telehealth: Payer: Self-pay | Admitting: *Deleted

## 2019-02-28 NOTE — Telephone Encounter (Signed)
   First follow up call attempt. Reached voicemail with name identified.  Message left to call if any questions or concerns. 

## 2019-02-28 NOTE — Op Note (Signed)
[facility] Patient Name: Heidi Davenport Procedure Date: 02/27/2019 10:22 AM MRN: [id number] Endoscopist: Tressia Danas MD, MD Age: 78 Referring MD:  Date of Birth: [birth year] Gender: Female Account #: [id number] Procedure:                Upper GI endoscopy Indications:              Dyspepsia, Dysphagia Medicines:                See the Anesthesia note for documentation of the                            administered medications Procedure:                Pre-Anesthesia Assessment:                           - Prior to the procedure, a History and Physical                            was performed, and patient medications and                            allergies were reviewed. The patient's tolerance of                            previous anesthesia was also reviewed. The risks                            and benefits of the procedure and the sedation                            options and risks were discussed with the patient.                            All questions were answered, and informed consent                            was obtained. Prior Anticoagulants: The patient has                            taken no previous anticoagulant or antiplatelet                            agents. ASA Grade Assessment: II - A patient with                            mild systemic disease. After reviewing the risks                            and benefits, the patient was deemed in                            satisfactory condition to undergo the procedure.  After obtaining informed consent, the endoscope was                            passed under direct vision. Throughout the                            procedure, the patient's blood pressure, pulse, and                            oxygen saturations were monitored continuously. The                            Endoscope was introduced through the mouth, and                            advanced to the third part of  duodenum. The upper                            GI endoscopy was accomplished without difficulty.                            The patient tolerated the procedure well. Scope In: Scope Out: Findings:                 LA Grade B (one or more mucosal breaks greater than                            5 mm, not extending between the tops of two mucosal                            folds) esophagitis with no bleeding was found. Two                            small islands were seen. Biopsies were taken with a                            cold forceps for histology. Estimated blood loss                            was minimal.                           Multiple localized, medium non-bleeding erosions                            were found in the gastric body. Two were linear.                            Several were closer to discrete ulcers. There were                            no stigmata of recent bleeding. Biopsies were taken  with a cold forceps for histology. Estimated blood                            loss was minimal. No blood present. No hiatal                            hernia present.                           The examined duodenum was normal.                           The exam was otherwise without abnormality. Complications:            No immediate complications. Estimated blood loss:                            Minimal. Estimated Blood Loss:     Estimated blood loss was minimal. Impression:               - LA Grade B esophagitis. Biopsied.                           - Non-bleeding erosive gastropathy. Biopsied.                           - Normal examined duodenum.                           - The examination was otherwise normal.                           - No dilation was performed today. Recommendation:           - Patient has a contact number available for                            emergencies. The signs and symptoms of potential                            delayed  complications were discussed with the                            patient. Return to normal activities tomorrow.                            Written discharge instructions were provided to the                            patient.                           - Resume regular diet.                           - Continue present medications. Complete 8 weeks of  omeprazole 40 mg daily.                           - Await pathology results.                           - No repeat upper endoscopy.                           - Return to GI office in 6-8 weeks. Tressia Danas MD, MD 02/27/2019 10:48:10 AM This report has been signed electronically.

## 2019-02-28 NOTE — Telephone Encounter (Signed)
  Follow up Call-  Call back number 02/27/2019  Post procedure Call Back phone  # [phone]  Permission to leave phone message Yes  Some recent data might be hidden     Patient questions:  Do you have a fever, pain , or abdominal swelling? No. Pain Score  0 *  Have you tolerated food without any problems? Yes.    Have you been able to return to your normal activities? Yes.    Do you have any questions about your discharge instructions: Diet   No. Medications  No. Follow up visit  No.  Do you have questions or concerns about your Care? No.  Actions: * If pain score is 4 or above: No action needed, pain <4.

## 2019-03-02 ENCOUNTER — Ambulatory Visit
Admission: RE | Admit: 2019-03-02 | Discharge: 2019-03-02 | Disposition: A | Discharge status: Home / Self Care | Payer: PPO | Source: Ambulatory Visit | Attending: Internal Medicine | Admitting: Internal Medicine

## 2019-03-02 DIAGNOSIS — Z1231 Encounter for screening mammogram for malignant neoplasm of breast: Secondary | ICD-10-CM | POA: Diagnosis not present

## 2019-03-05 ENCOUNTER — Other Ambulatory Visit: Payer: Self-pay

## 2019-03-05 ENCOUNTER — Telehealth: Payer: Self-pay | Admitting: Emergency Medicine

## 2019-03-05 MED ORDER — CLARITHROMYCIN 500 MG PO TABS: 500.0000 mg | ORAL_TABLET | Freq: Two times a day (BID) | ORAL | 0 refills | Status: DC

## 2019-03-05 MED ORDER — AMOXICILLIN 500 MG PO CAPS: 1000.0000 mg | ORAL_CAPSULE | Freq: Two times a day (BID) | ORAL | 0 refills | Status: DC

## 2019-03-05 NOTE — Telephone Encounter (Signed)
Pharmacist at [facility] states patient has an allergy to amoxicillin please advise alternative.

## 2019-03-05 NOTE — Telephone Encounter (Signed)
Patient has HA as her listed allergy.  They will fill. I spoke with pharmacy

## 2019-03-05 NOTE — Telephone Encounter (Signed)
Thank you :)

## 2019-03-07 DIAGNOSIS — M150 Primary generalized (osteo)arthritis: Secondary | ICD-10-CM | POA: Diagnosis not present

## 2019-03-07 DIAGNOSIS — L400 Psoriasis vulgaris: Secondary | ICD-10-CM | POA: Diagnosis not present

## 2019-03-07 DIAGNOSIS — M25561 Pain in right knee: Secondary | ICD-10-CM | POA: Diagnosis not present

## 2019-03-07 DIAGNOSIS — M5136 Other intervertebral disc degeneration, lumbar region: Secondary | ICD-10-CM | POA: Diagnosis not present

## 2019-03-07 DIAGNOSIS — E663 Overweight: Secondary | ICD-10-CM | POA: Diagnosis not present

## 2019-03-07 DIAGNOSIS — Z6829 Body mass index (BMI) 29.0-29.9, adult: Secondary | ICD-10-CM | POA: Diagnosis not present

## 2019-03-07 DIAGNOSIS — L4059 Other psoriatic arthropathy: Secondary | ICD-10-CM | POA: Diagnosis not present

## 2019-03-07 DIAGNOSIS — M810 Age-related osteoporosis without current pathological fracture: Secondary | ICD-10-CM | POA: Diagnosis not present

## 2019-03-12 ENCOUNTER — Ambulatory Visit: Payer: PPO | Admitting: Gynecology

## 2019-03-20 ENCOUNTER — Ambulatory Visit: Payer: PPO | Admitting: Gynecology

## 2019-03-23 ENCOUNTER — Telehealth: Payer: Self-pay | Admitting: Emergency Medicine

## 2019-03-23 MED ORDER — NYSTATIN 100000 UNIT/ML MT SUSP: 5.0000 mL | Freq: Four times a day (QID) | OROMUCOSAL | 0 refills | Status: AC

## 2019-03-23 NOTE — Telephone Encounter (Signed)
Spoke to patient about her follow up on 4/17 she states she is feeling much better, no stomach problems. She does have kind of a white rash on her tongue she is concerned might be thrush from the antibiotics she took. Please advise if she needs a medication for that. Appointment has been canceled.

## 2019-03-23 NOTE — Telephone Encounter (Signed)
Noted. I will set a reminder to call her in 8 weeks and have her stop Protonix and have the stool test at the proper time. Rx sent to pharmacy. Pt aware.

## 2019-03-23 NOTE — Telephone Encounter (Signed)
I am delighted to hear that she is feeling better.  After completing 8 weeks of Protonix, I would want her to wait 8 weeks off proton pump inhibitor therapy and have an H pylori stool antigen test performed.  Please arrange for this follow-up test.  Please prescribe nystatin swish and swallow 5 mg p.o. 4 times daily swish and spit to be used for 7 days for her concerns of oral thrush.  1 week supply provided.  No refills.  Thank you.

## 2019-04-13 ENCOUNTER — Ambulatory Visit: Payer: Self-pay | Admitting: Gastroenterology

## 2019-06-11 DIAGNOSIS — M150 Primary generalized (osteo)arthritis: Secondary | ICD-10-CM | POA: Diagnosis not present

## 2019-06-11 DIAGNOSIS — M25561 Pain in right knee: Secondary | ICD-10-CM | POA: Diagnosis not present

## 2019-06-11 DIAGNOSIS — Z6828 Body mass index (BMI) 28.0-28.9, adult: Secondary | ICD-10-CM | POA: Diagnosis not present

## 2019-06-11 DIAGNOSIS — L400 Psoriasis vulgaris: Secondary | ICD-10-CM | POA: Diagnosis not present

## 2019-06-11 DIAGNOSIS — M5136 Other intervertebral disc degeneration, lumbar region: Secondary | ICD-10-CM | POA: Diagnosis not present

## 2019-06-11 DIAGNOSIS — L4059 Other psoriatic arthropathy: Secondary | ICD-10-CM | POA: Diagnosis not present

## 2019-06-11 DIAGNOSIS — M810 Age-related osteoporosis without current pathological fracture: Secondary | ICD-10-CM | POA: Diagnosis not present

## 2019-06-11 DIAGNOSIS — E663 Overweight: Secondary | ICD-10-CM | POA: Diagnosis not present

## 2019-08-29 DIAGNOSIS — M810 Age-related osteoporosis without current pathological fracture: Secondary | ICD-10-CM | POA: Diagnosis not present

## 2019-08-29 DIAGNOSIS — M25561 Pain in right knee: Secondary | ICD-10-CM | POA: Diagnosis not present

## 2019-08-29 DIAGNOSIS — Z6826 Body mass index (BMI) 26.0-26.9, adult: Secondary | ICD-10-CM | POA: Diagnosis not present

## 2019-08-29 DIAGNOSIS — L400 Psoriasis vulgaris: Secondary | ICD-10-CM | POA: Diagnosis not present

## 2019-08-29 DIAGNOSIS — M150 Primary generalized (osteo)arthritis: Secondary | ICD-10-CM | POA: Diagnosis not present

## 2019-08-29 DIAGNOSIS — M5136 Other intervertebral disc degeneration, lumbar region: Secondary | ICD-10-CM | POA: Diagnosis not present

## 2019-08-29 DIAGNOSIS — E663 Overweight: Secondary | ICD-10-CM | POA: Diagnosis not present

## 2019-08-29 DIAGNOSIS — L4059 Other psoriatic arthropathy: Secondary | ICD-10-CM | POA: Diagnosis not present

## 2019-10-04 ENCOUNTER — Encounter: Payer: Self-pay | Admitting: Gynecology

## 2019-10-30 DIAGNOSIS — M25531 Pain in right wrist: Secondary | ICD-10-CM | POA: Diagnosis not present

## 2019-10-30 DIAGNOSIS — S63501A Unspecified sprain of right wrist, initial encounter: Secondary | ICD-10-CM | POA: Diagnosis not present

## 2019-11-12 DIAGNOSIS — S52501A Unspecified fracture of the lower end of right radius, initial encounter for closed fracture: Secondary | ICD-10-CM | POA: Diagnosis not present

## 2019-11-28 DIAGNOSIS — M5136 Other intervertebral disc degeneration, lumbar region: Secondary | ICD-10-CM | POA: Diagnosis not present

## 2019-11-28 DIAGNOSIS — L4059 Other psoriatic arthropathy: Secondary | ICD-10-CM | POA: Diagnosis not present

## 2019-11-28 DIAGNOSIS — E663 Overweight: Secondary | ICD-10-CM | POA: Diagnosis not present

## 2019-11-28 DIAGNOSIS — L400 Psoriasis vulgaris: Secondary | ICD-10-CM | POA: Diagnosis not present

## 2019-11-28 DIAGNOSIS — M810 Age-related osteoporosis without current pathological fracture: Secondary | ICD-10-CM | POA: Diagnosis not present

## 2019-11-28 DIAGNOSIS — M150 Primary generalized (osteo)arthritis: Secondary | ICD-10-CM | POA: Diagnosis not present

## 2019-11-28 DIAGNOSIS — M25561 Pain in right knee: Secondary | ICD-10-CM | POA: Diagnosis not present

## 2019-11-28 DIAGNOSIS — Z6828 Body mass index (BMI) 28.0-28.9, adult: Secondary | ICD-10-CM | POA: Diagnosis not present

## 2019-12-03 DIAGNOSIS — S52501D Unspecified fracture of the lower end of right radius, subsequent encounter for closed fracture with routine healing: Secondary | ICD-10-CM | POA: Diagnosis not present

## 2019-12-11 DIAGNOSIS — M859 Disorder of bone density and structure, unspecified: Secondary | ICD-10-CM | POA: Diagnosis not present

## 2019-12-11 DIAGNOSIS — R7301 Impaired fasting glucose: Secondary | ICD-10-CM | POA: Diagnosis not present

## 2019-12-11 DIAGNOSIS — E7849 Other hyperlipidemia: Secondary | ICD-10-CM | POA: Diagnosis not present

## 2019-12-11 DIAGNOSIS — I10 Essential (primary) hypertension: Secondary | ICD-10-CM | POA: Diagnosis not present

## 2019-12-14 DIAGNOSIS — R296 Repeated falls: Secondary | ICD-10-CM | POA: Diagnosis not present

## 2019-12-14 DIAGNOSIS — K219 Gastro-esophageal reflux disease without esophagitis: Secondary | ICD-10-CM | POA: Diagnosis not present

## 2019-12-14 DIAGNOSIS — M8580 Other specified disorders of bone density and structure, unspecified site: Secondary | ICD-10-CM | POA: Diagnosis not present

## 2019-12-14 DIAGNOSIS — I10 Essential (primary) hypertension: Secondary | ICD-10-CM | POA: Diagnosis not present

## 2019-12-14 DIAGNOSIS — D849 Immunodeficiency, unspecified: Secondary | ICD-10-CM | POA: Diagnosis not present

## 2019-12-14 DIAGNOSIS — L4050 Arthropathic psoriasis, unspecified: Secondary | ICD-10-CM | POA: Diagnosis not present

## 2019-12-14 DIAGNOSIS — S6290XS Unspecified fracture of unspecified wrist and hand, sequela: Secondary | ICD-10-CM | POA: Diagnosis not present

## 2019-12-14 DIAGNOSIS — R7301 Impaired fasting glucose: Secondary | ICD-10-CM | POA: Diagnosis not present

## 2019-12-14 DIAGNOSIS — E781 Pure hyperglyceridemia: Secondary | ICD-10-CM | POA: Diagnosis not present

## 2019-12-14 DIAGNOSIS — Z0000 Encounter for general adult medical examination without abnormal findings: Secondary | ICD-10-CM | POA: Diagnosis not present

## 2019-12-14 DIAGNOSIS — F3341 Major depressive disorder, recurrent, in partial remission: Secondary | ICD-10-CM | POA: Diagnosis not present

## 2019-12-14 DIAGNOSIS — E785 Hyperlipidemia, unspecified: Secondary | ICD-10-CM | POA: Diagnosis not present

## 2019-12-19 DIAGNOSIS — S52501D Unspecified fracture of the lower end of right radius, subsequent encounter for closed fracture with routine healing: Secondary | ICD-10-CM | POA: Diagnosis not present

## 2019-12-25 DIAGNOSIS — R269 Unspecified abnormalities of gait and mobility: Secondary | ICD-10-CM | POA: Diagnosis not present

## 2019-12-25 DIAGNOSIS — R296 Repeated falls: Secondary | ICD-10-CM | POA: Diagnosis not present

## 2020-01-26 ENCOUNTER — Ambulatory Visit: Payer: Self-pay

## 2020-02-01 ENCOUNTER — Ambulatory Visit: Payer: PPO | Attending: Internal Medicine

## 2020-02-01 DIAGNOSIS — Z23 Encounter for immunization: Secondary | ICD-10-CM

## 2020-02-01 NOTE — Progress Notes (Signed)
   Covid-19 Vaccination Clinic  Name:  Heidi Davenport    MRN: [id number] DOB: [birth year]  02/01/2020  Heidi Davenport was observed post Covid-19 immunization for 15 minutes without incidence. She was provided with Vaccine Information Sheet and instruction to access the V-Safe system.   Heidi Davenport was instructed to call 911 with any severe reactions post vaccine: Marland Kitchen Difficulty breathing  . Swelling of your face and throat  . A fast heartbeat  . A bad rash all over your body  . Dizziness and weakness    Immunizations Administered    Name Date Dose VIS Date Route   Pfizer COVID-19 Vaccine 02/01/2020 11:12 AM 0.3 mL 12/07/2019 Intramuscular   Manufacturer: [employer], [employer]   Lot: [id number]   NDC: [address]-6

## 2020-02-20 ENCOUNTER — Other Ambulatory Visit: Payer: Self-pay | Admitting: Gastroenterology

## 2020-02-27 ENCOUNTER — Ambulatory Visit: Payer: PPO | Attending: Internal Medicine

## 2020-02-27 DIAGNOSIS — Z23 Encounter for immunization: Secondary | ICD-10-CM | POA: Insufficient documentation

## 2020-02-27 NOTE — Progress Notes (Signed)
   Covid-19 Vaccination Clinic  Name:  Heidi Davenport    MRN: [id number] DOB: [birth year]  02/27/2020  [name] was observed post Covid-19 immunization for 15 minutes without incident. She was provided with Vaccine Information Sheet and instruction to access the V-Safe system.   [name] was instructed to call 911 with any severe reactions post vaccine: Marland Kitchen Difficulty breathing  . Swelling of face and throat  . A fast heartbeat  . A bad rash all over body  . Dizziness and weakness   Immunizations Administered    Name Date Dose VIS Date Route   Pfizer COVID-19 Vaccine 02/27/2020  9:57 AM 0.3 mL 12/07/2019 Intramuscular   Manufacturer: [employer], [employer]   Lot: [id number]   NDC: [address]-2

## 2020-03-03 DIAGNOSIS — L4059 Other psoriatic arthropathy: Secondary | ICD-10-CM | POA: Diagnosis not present

## 2020-03-03 DIAGNOSIS — Z6828 Body mass index (BMI) 28.0-28.9, adult: Secondary | ICD-10-CM | POA: Diagnosis not present

## 2020-03-03 DIAGNOSIS — M25561 Pain in right knee: Secondary | ICD-10-CM | POA: Diagnosis not present

## 2020-03-03 DIAGNOSIS — E663 Overweight: Secondary | ICD-10-CM | POA: Diagnosis not present

## 2020-03-03 DIAGNOSIS — M5136 Other intervertebral disc degeneration, lumbar region: Secondary | ICD-10-CM | POA: Diagnosis not present

## 2020-03-03 DIAGNOSIS — L400 Psoriasis vulgaris: Secondary | ICD-10-CM | POA: Diagnosis not present

## 2020-03-03 DIAGNOSIS — M150 Primary generalized (osteo)arthritis: Secondary | ICD-10-CM | POA: Diagnosis not present

## 2020-03-03 DIAGNOSIS — M810 Age-related osteoporosis without current pathological fracture: Secondary | ICD-10-CM | POA: Diagnosis not present

## 2020-04-17 ENCOUNTER — Other Ambulatory Visit: Payer: Self-pay | Admitting: Internal Medicine

## 2020-04-17 DIAGNOSIS — Z1231 Encounter for screening mammogram for malignant neoplasm of breast: Secondary | ICD-10-CM

## 2020-05-07 ENCOUNTER — Ambulatory Visit: Payer: PPO

## 2020-06-03 DIAGNOSIS — L4059 Other psoriatic arthropathy: Secondary | ICD-10-CM | POA: Diagnosis not present

## 2020-08-13 DIAGNOSIS — M150 Primary generalized (osteo)arthritis: Secondary | ICD-10-CM | POA: Diagnosis not present

## 2020-08-13 DIAGNOSIS — M810 Age-related osteoporosis without current pathological fracture: Secondary | ICD-10-CM | POA: Diagnosis not present

## 2020-08-13 DIAGNOSIS — L4059 Other psoriatic arthropathy: Secondary | ICD-10-CM | POA: Diagnosis not present

## 2020-08-13 DIAGNOSIS — E663 Overweight: Secondary | ICD-10-CM | POA: Diagnosis not present

## 2020-08-13 DIAGNOSIS — Z6828 Body mass index (BMI) 28.0-28.9, adult: Secondary | ICD-10-CM | POA: Diagnosis not present

## 2020-08-13 DIAGNOSIS — M25561 Pain in right knee: Secondary | ICD-10-CM | POA: Diagnosis not present

## 2020-08-13 DIAGNOSIS — M5136 Other intervertebral disc degeneration, lumbar region: Secondary | ICD-10-CM | POA: Diagnosis not present

## 2020-08-13 DIAGNOSIS — L400 Psoriasis vulgaris: Secondary | ICD-10-CM | POA: Diagnosis not present

## 2020-09-02 DIAGNOSIS — Z961 Presence of intraocular lens: Secondary | ICD-10-CM | POA: Diagnosis not present

## 2020-09-02 DIAGNOSIS — H524 Presbyopia: Secondary | ICD-10-CM | POA: Diagnosis not present

## 2020-09-02 DIAGNOSIS — H353132 Nonexudative age-related macular degeneration, bilateral, intermediate dry stage: Secondary | ICD-10-CM | POA: Diagnosis not present

## 2020-09-09 ENCOUNTER — Ambulatory Visit: Payer: PPO

## 2020-09-16 ENCOUNTER — Ambulatory Visit (INDEPENDENT_AMBULATORY_CARE_PROVIDER_SITE_OTHER): Payer: PPO

## 2020-09-16 ENCOUNTER — Ambulatory Visit (INDEPENDENT_AMBULATORY_CARE_PROVIDER_SITE_OTHER): Payer: PPO | Admitting: Podiatry

## 2020-09-16 ENCOUNTER — Encounter: Payer: Self-pay | Admitting: Podiatry

## 2020-09-16 ENCOUNTER — Other Ambulatory Visit: Payer: Self-pay

## 2020-09-16 DIAGNOSIS — L02619 Cutaneous abscess of unspecified foot: Secondary | ICD-10-CM

## 2020-09-16 DIAGNOSIS — L03116 Cellulitis of left lower limb: Secondary | ICD-10-CM

## 2020-09-16 DIAGNOSIS — L03119 Cellulitis of unspecified part of limb: Secondary | ICD-10-CM

## 2020-09-16 MED ORDER — DOXYCYCLINE HYCLATE 100 MG PO TABS: 100.0000 mg | ORAL_TABLET | Freq: Two times a day (BID) | ORAL | 0 refills | Status: DC

## 2020-09-16 NOTE — Progress Notes (Signed)
She presents today chief complaint of a painful area beneath the first metatarsophalangeal joint of the left foot.  States it is red and callus swollen extremely sore for the past 3 to 4 days states that she has been under a lot of stress and she had a psoriatic flareup but she does not believe this is related to that.  States that she is about to have her house totally renovated and she is going to be leaving the house for about 34month October 3 and would like to have this taken care of by then.  Objective: Vital signs are stable she is alert and oriented x3 I have reviewed her past medical history medications allergies surgeries and social history.  Pulses remain palpable to the left foot.  She has a 3cm in diameter area of erythema surrounding a central whitish colored abscess.  There is mild reactive hyperkeratosis over this.  I debrided the small hyperkeratotic tissue and some serosanguineous fluid was draining.  There is no purulence and no malodor.  There was no foreign body that I can see.  I decided to do x-rays x-rays did not demonstrate any type of foreign body.  Assessment: Cellulitis plantar aspect first metatarsophalangeal joint left.  Plan: I will let her start soaking little Epson salts and warm water.  I want her to start on doxycycline for the next week I will follow-up with her at that time to make sure she is doing better.  Should she start to see an increase in the size of the redness or streaking of the redness or start to develop fever or chills or any other signs of infection she knows to go to the emergency department.

## 2020-09-24 ENCOUNTER — Telehealth: Payer: Self-pay | Admitting: Podiatry

## 2020-09-24 NOTE — Telephone Encounter (Signed)
Pt called and stated she was exposed to covid but her foot is healing good. She will call back to r/s if needed

## 2020-09-25 ENCOUNTER — Ambulatory Visit: Payer: PPO | Admitting: Podiatry

## 2020-11-22 ENCOUNTER — Other Ambulatory Visit: Payer: Self-pay | Admitting: Gastroenterology

## 2020-11-23 NOTE — Telephone Encounter (Signed)
May have refill until time of office visit. Needs at least annual follow-up for refills. Thanks.

## 2020-12-18 DIAGNOSIS — L4059 Other psoriatic arthropathy: Secondary | ICD-10-CM | POA: Diagnosis not present

## 2021-02-17 DIAGNOSIS — M810 Age-related osteoporosis without current pathological fracture: Secondary | ICD-10-CM | POA: Diagnosis not present

## 2021-02-17 DIAGNOSIS — L400 Psoriasis vulgaris: Secondary | ICD-10-CM | POA: Diagnosis not present

## 2021-02-17 DIAGNOSIS — M5136 Other intervertebral disc degeneration, lumbar region: Secondary | ICD-10-CM | POA: Diagnosis not present

## 2021-02-17 DIAGNOSIS — M150 Primary generalized (osteo)arthritis: Secondary | ICD-10-CM | POA: Diagnosis not present

## 2021-02-17 DIAGNOSIS — E663 Overweight: Secondary | ICD-10-CM | POA: Diagnosis not present

## 2021-02-17 DIAGNOSIS — Z6829 Body mass index (BMI) 29.0-29.9, adult: Secondary | ICD-10-CM | POA: Diagnosis not present

## 2021-02-17 DIAGNOSIS — L4059 Other psoriatic arthropathy: Secondary | ICD-10-CM | POA: Diagnosis not present

## 2021-04-23 DIAGNOSIS — U071 COVID-19: Secondary | ICD-10-CM | POA: Diagnosis not present

## 2021-04-23 DIAGNOSIS — Z1152 Encounter for screening for COVID-19: Secondary | ICD-10-CM | POA: Diagnosis not present

## 2021-04-23 DIAGNOSIS — R058 Other specified cough: Secondary | ICD-10-CM | POA: Diagnosis not present

## 2021-07-03 DIAGNOSIS — M810 Age-related osteoporosis without current pathological fracture: Secondary | ICD-10-CM | POA: Diagnosis not present

## 2021-07-03 DIAGNOSIS — M150 Primary generalized (osteo)arthritis: Secondary | ICD-10-CM | POA: Diagnosis not present

## 2021-07-03 DIAGNOSIS — Z6827 Body mass index (BMI) 27.0-27.9, adult: Secondary | ICD-10-CM | POA: Diagnosis not present

## 2021-07-03 DIAGNOSIS — R5382 Chronic fatigue, unspecified: Secondary | ICD-10-CM | POA: Diagnosis not present

## 2021-07-03 DIAGNOSIS — M5136 Other intervertebral disc degeneration, lumbar region: Secondary | ICD-10-CM | POA: Diagnosis not present

## 2021-07-03 DIAGNOSIS — L4059 Other psoriatic arthropathy: Secondary | ICD-10-CM | POA: Diagnosis not present

## 2021-07-03 DIAGNOSIS — E663 Overweight: Secondary | ICD-10-CM | POA: Diagnosis not present

## 2021-07-03 DIAGNOSIS — L400 Psoriasis vulgaris: Secondary | ICD-10-CM | POA: Diagnosis not present

## 2021-07-30 DIAGNOSIS — M859 Disorder of bone density and structure, unspecified: Secondary | ICD-10-CM | POA: Diagnosis not present

## 2021-07-30 DIAGNOSIS — E785 Hyperlipidemia, unspecified: Secondary | ICD-10-CM | POA: Diagnosis not present

## 2021-07-30 DIAGNOSIS — R7301 Impaired fasting glucose: Secondary | ICD-10-CM | POA: Diagnosis not present

## 2021-08-06 ENCOUNTER — Other Ambulatory Visit: Payer: Self-pay | Admitting: Internal Medicine

## 2021-08-06 DIAGNOSIS — R82998 Other abnormal findings in urine: Secondary | ICD-10-CM | POA: Diagnosis not present

## 2021-08-06 DIAGNOSIS — K219 Gastro-esophageal reflux disease without esophagitis: Secondary | ICD-10-CM | POA: Diagnosis not present

## 2021-08-06 DIAGNOSIS — Z1231 Encounter for screening mammogram for malignant neoplasm of breast: Secondary | ICD-10-CM

## 2021-08-06 DIAGNOSIS — G2581 Restless legs syndrome: Secondary | ICD-10-CM | POA: Diagnosis not present

## 2021-08-06 DIAGNOSIS — R7301 Impaired fasting glucose: Secondary | ICD-10-CM | POA: Diagnosis not present

## 2021-08-06 DIAGNOSIS — D849 Immunodeficiency, unspecified: Secondary | ICD-10-CM | POA: Diagnosis not present

## 2021-08-06 DIAGNOSIS — Z1331 Encounter for screening for depression: Secondary | ICD-10-CM | POA: Diagnosis not present

## 2021-08-06 DIAGNOSIS — L4050 Arthropathic psoriasis, unspecified: Secondary | ICD-10-CM | POA: Diagnosis not present

## 2021-08-06 DIAGNOSIS — I10 Essential (primary) hypertension: Secondary | ICD-10-CM | POA: Diagnosis not present

## 2021-08-06 DIAGNOSIS — Z1339 Encounter for screening examination for other mental health and behavioral disorders: Secondary | ICD-10-CM | POA: Diagnosis not present

## 2021-08-06 DIAGNOSIS — M810 Age-related osteoporosis without current pathological fracture: Secondary | ICD-10-CM | POA: Diagnosis not present

## 2021-08-06 DIAGNOSIS — E785 Hyperlipidemia, unspecified: Secondary | ICD-10-CM | POA: Diagnosis not present

## 2021-08-06 DIAGNOSIS — R296 Repeated falls: Secondary | ICD-10-CM | POA: Diagnosis not present

## 2021-08-06 DIAGNOSIS — F3341 Major depressive disorder, recurrent, in partial remission: Secondary | ICD-10-CM | POA: Diagnosis not present

## 2021-08-06 DIAGNOSIS — Z0000 Encounter for general adult medical examination without abnormal findings: Secondary | ICD-10-CM | POA: Diagnosis not present

## 2021-08-07 ENCOUNTER — Other Ambulatory Visit: Payer: Self-pay

## 2021-08-07 ENCOUNTER — Ambulatory Visit
Admission: RE | Admit: 2021-08-07 | Discharge: 2021-08-07 | Disposition: A | Discharge status: Home / Self Care | Payer: PPO | Source: Ambulatory Visit | Attending: Internal Medicine | Admitting: Internal Medicine

## 2021-08-07 DIAGNOSIS — Z1231 Encounter for screening mammogram for malignant neoplasm of breast: Secondary | ICD-10-CM

## 2021-09-17 DIAGNOSIS — Z23 Encounter for immunization: Secondary | ICD-10-CM | POA: Diagnosis not present

## 2021-09-17 DIAGNOSIS — F3341 Major depressive disorder, recurrent, in partial remission: Secondary | ICD-10-CM | POA: Diagnosis not present

## 2021-09-17 DIAGNOSIS — M810 Age-related osteoporosis without current pathological fracture: Secondary | ICD-10-CM | POA: Diagnosis not present

## 2021-10-06 DIAGNOSIS — M5136 Other intervertebral disc degeneration, lumbar region: Secondary | ICD-10-CM | POA: Diagnosis not present

## 2021-10-06 DIAGNOSIS — Z6827 Body mass index (BMI) 27.0-27.9, adult: Secondary | ICD-10-CM | POA: Diagnosis not present

## 2021-10-06 DIAGNOSIS — E663 Overweight: Secondary | ICD-10-CM | POA: Diagnosis not present

## 2021-10-06 DIAGNOSIS — M150 Primary generalized (osteo)arthritis: Secondary | ICD-10-CM | POA: Diagnosis not present

## 2021-10-06 DIAGNOSIS — L4059 Other psoriatic arthropathy: Secondary | ICD-10-CM | POA: Diagnosis not present

## 2021-10-06 DIAGNOSIS — L400 Psoriasis vulgaris: Secondary | ICD-10-CM | POA: Diagnosis not present

## 2021-10-06 DIAGNOSIS — M810 Age-related osteoporosis without current pathological fracture: Secondary | ICD-10-CM | POA: Diagnosis not present

## 2021-12-24 DIAGNOSIS — L400 Psoriasis vulgaris: Secondary | ICD-10-CM | POA: Diagnosis not present

## 2021-12-24 DIAGNOSIS — M150 Primary generalized (osteo)arthritis: Secondary | ICD-10-CM | POA: Diagnosis not present

## 2021-12-24 DIAGNOSIS — M5136 Other intervertebral disc degeneration, lumbar region: Secondary | ICD-10-CM | POA: Diagnosis not present

## 2021-12-24 DIAGNOSIS — Z6827 Body mass index (BMI) 27.0-27.9, adult: Secondary | ICD-10-CM | POA: Diagnosis not present

## 2021-12-24 DIAGNOSIS — M25561 Pain in right knee: Secondary | ICD-10-CM | POA: Diagnosis not present

## 2021-12-24 DIAGNOSIS — E663 Overweight: Secondary | ICD-10-CM | POA: Diagnosis not present

## 2021-12-24 DIAGNOSIS — M810 Age-related osteoporosis without current pathological fracture: Secondary | ICD-10-CM | POA: Diagnosis not present

## 2021-12-24 DIAGNOSIS — M25562 Pain in left knee: Secondary | ICD-10-CM | POA: Diagnosis not present

## 2021-12-24 DIAGNOSIS — L4059 Other psoriatic arthropathy: Secondary | ICD-10-CM | POA: Diagnosis not present

## 2022-02-17 DIAGNOSIS — M5136 Other intervertebral disc degeneration, lumbar region: Secondary | ICD-10-CM | POA: Insufficient documentation

## 2022-02-17 DIAGNOSIS — L4059 Other psoriatic arthropathy: Secondary | ICD-10-CM | POA: Insufficient documentation

## 2022-02-17 DIAGNOSIS — E663 Overweight: Secondary | ICD-10-CM | POA: Insufficient documentation

## 2022-02-17 DIAGNOSIS — G9332 Myalgic encephalomyelitis/chronic fatigue syndrome: Secondary | ICD-10-CM | POA: Insufficient documentation

## 2022-02-17 DIAGNOSIS — M1991 Primary osteoarthritis, unspecified site: Secondary | ICD-10-CM | POA: Insufficient documentation

## 2022-02-17 DIAGNOSIS — L400 Psoriasis vulgaris: Secondary | ICD-10-CM | POA: Insufficient documentation

## 2022-02-17 DIAGNOSIS — M810 Age-related osteoporosis without current pathological fracture: Secondary | ICD-10-CM | POA: Insufficient documentation

## 2022-02-17 NOTE — Progress Notes (Signed)
02/18/2022 Heidi Davenport [id number] 18-Sep-1941   ASSESSMENT AND PLAN:   Esophageal dysphagia  EGD to evaluate for structural abnormality, tumor, erosive/infectious esophagititis, and EOE.   If the EGD is negative can then proceed to barium swallow Can do trial of PPI in the mean time- start on omeprazole once daily I discussed risks of EGD with patient today, including risk of sedation, bleeding or perforation.  Patient provides understanding and gave verbal consent to proceed.  Other gastritis without hemorrhage, unspecified chronicity Unknown eradication of  H pylori, patient does not want to do stool sample at this time, will test with EGD per patient perference  History of Present Illness:  81 y.o. female  with a past medical history of asthma, psoriatic arthritis on methotrexate, anxiety, history of cholecystectomy and others listed below, known to Dr. Orvan Falconer returns to clinic today for evaluation of dysphagia.  Last saw Dr. Orvan Falconer 02/21/2019 for severe reflux and dysphagia to meats 02/27/2019 endoscopy which showed LA grade B esophagitis, nonbleeding erosive gastropathy, normal duodenum.   Patient was H. pylori positive given triple therapy.   I do not see where patient had eradication study. Brought daughter Heidi Davenport with her. Walks with a cane, has life alert.  She has been struggling with depression over last several years.   She states Dec 2nd she had dysphagia and again Dec 20th, happening about once a week per daughter.  No smoking, no ETOH history. She is on NSAIDS once or twice a week.  Denies GERD in last 1-2 years, no N/V.  No AB pain, no melena, no hematochezia.  She is not on PPI.   Has had some weight loss in last 1-2 years without trying. Per patient 200 to 168.  Wt Readings from Last 3 Encounters:  02/18/22 168 lb 4 oz (76.3 kg)  02/27/19 178 lb (80.7 kg)  02/21/19 178 lb 2 oz (80.8 kg)    Previous GI history: 02/27/2019 EGD  LA grade B esophagitis,  nonbleeding erosive gastropathy, normal duodenum.  Patient was H. pylori positive given triple therapy. 11/26/2014 colonoscopy with Dr. Arlyce Dice  completely normal other than internal hemorrhoids no recall due to age  Current Medications:    Current Outpatient Medications (Cardiovascular):    lisinopril (PRINIVIL,ZESTRIL) 20 MG tablet, Take 20 mg by mouth daily.  Current Outpatient Medications (Respiratory):    albuterol (PROVENTIL HFA;VENTOLIN HFA) 108 (90 Base) MCG/ACT inhaler, Inhale 2 puffs into the lungs every 6 (six) hours as needed for wheezing or shortness of breath.   fexofenadine (ALLEGRA) 180 MG tablet, Take 180 mg by mouth daily.   fluticasone (FLONASE) 50 MCG/ACT nasal spray, Place 1 spray into both nostrils daily as needed for allergies or rhinitis.  Current Outpatient Medications (Analgesics):    ibuprofen (ADVIL,MOTRIN) 200 MG tablet, Take 200 mg by mouth as needed for moderate pain.   Current Outpatient Medications (Hematological):    folic acid (FOLVITE) 800 MCG tablet, Take 800 mcg by mouth daily.  Current Outpatient Medications (Other):    buPROPion (WELLBUTRIN XL) 150 MG 24 hr tablet, Take 1 tablet by mouth daily.   calcium carbonate (OSCAL) 1500 (600 Ca) MG TABS tablet, Take 1 tablet by mouth daily.   cholecalciferol (VITAMIN D) 1000 units tablet, Take 2,000 Units by mouth daily.   diazepam (VALIUM) 10 MG tablet, Take 10 mg by mouth daily as needed.   methotrexate (RHEUMATREX) 2.5 MG tablet, Take 5-7.5 mg by mouth as directed. Caution:Chemotherapy. Protect from light. Takes 3 tablets on  Friday, 2 tablets on Saturday    Multiple Vitamin (MULTIVITAMIN WITH MINERALS) TABS tablet, Take 1 tablet by mouth daily.   Multiple Vitamins-Minerals (PRESERVISION AREDS 2+MULTI VIT PO), Take 1 tablet by mouth 2 (two) times daily.   potassium chloride SA (K-DUR,KLOR-CON) 20 MEQ tablet, Take 20 mEq by mouth daily.   rOPINIRole (REQUIP) 0.5 MG tablet, Take 0.5 mg by mouth at bedtime.    sertraline (ZOLOFT) 100 MG tablet, Take 100 mg by mouth daily.  Surgical History:  She  has a past surgical history that includes Cataract extraction; Tonsillectomy; Tubal ligation; Knee surgery; Cholecystectomy; and Abdominal hysterectomy. Family History:  Her family history includes Dementia in her sister; Diabetes in her daughter and sister; Heart attack in her daughter and sister; Lung disease in her brother; Parkinson's disease in her father; Psoriasis in her father; Rheum arthritis in her sister; Splenomegaly in her daughter and son; Throat cancer in her mother. Social History:   reports that she has never smoked. She has never used smokeless tobacco. She reports that she does not drink alcohol and does not use drugs.  Current Medications, Allergies, Past Medical History, Past Surgical History, Family History and Social History were reviewed in [employer] record.  Physical Exam: BP (!) 144/92 (BP Location: Left Arm, Patient Position: Sitting, Cuff Size: Normal)    Pulse 100    Ht 5' 4.75" (1.645 m)    Wt 168 lb 4 oz (76.3 kg)    BMI 28.21 kg/m  General:   Pleasant, well developed female in no acute distress Eyes: sclerae anicteric,conjunctive pink  Heart:  regular rate and rhythm, slight systolic Pulm: Clear anteriorly; no wheezing Abdomen:  Soft, Obese AB, skin exam normal, Normal bowel sounds.  no  tenderness . Without guarding and Without rebound, without hepatomegaly. Extremities:  Without edema. Peripheral pulses intact.  Neurologic:  Alert and  oriented x4;  grossly normal neurologically. Skin:   Has some rash bilateral legs.  Psychiatric: Demonstrates good judgement and reason without abnormal affect or behaviors.  [name], PA-C 02/18/22

## 2022-02-18 ENCOUNTER — Encounter: Payer: Self-pay | Admitting: Physician Assistant

## 2022-02-18 ENCOUNTER — Ambulatory Visit: Payer: Medicare Other | Admitting: Physician Assistant

## 2022-02-18 VITALS — BP 144/92 | HR 100 | Ht 64.75 in | Wt 168.2 lb

## 2022-02-18 DIAGNOSIS — K2960 Other gastritis without bleeding: Secondary | ICD-10-CM | POA: Diagnosis not present

## 2022-02-18 DIAGNOSIS — R1319 Other dysphagia: Secondary | ICD-10-CM

## 2022-02-18 MED ORDER — OMEPRAZOLE 40 MG PO CPDR: 40.0000 mg | DELAYED_RELEASE_CAPSULE | Freq: Every day | ORAL | 1 refills | Status: DC

## 2022-02-18 NOTE — Patient Instructions (Addendum)
You have been scheduled for a EGD. Please follow the written instructions given to you at your visit today. If you use inhalers (even only as needed), please bring them with you on the day of your procedure.  Please take your proton pump inhibitor medication 30 minutes to 1 hour before meals- this makes it more effective. WILL DO ONE A DAY UNTIL THE EGD Avoid spicy and acidic foods Avoid fatty foods Limit your intake of coffee, tea, alcohol, and carbonated drinks Work to maintain a healthy weight Keep the head of the bed elevated at least 3 inches with blocks or a wedge pillow if you are having any nighttime symptoms Stay upright for 2 hours after eating Avoid meals and snacks three to four hours before bedtime  Gastroesophageal Reflux Disease, Adult Gastroesophageal reflux (GER) happens when acid from the stomach flows up into the tube that connects the mouth and the stomach (esophagus). Normally, food travels down the esophagus and stays in the stomach to be digested. However, when a person has GER, food and stomach acid sometimes move back up into the esophagus. If this becomes a more serious problem, the person may be diagnosed with a disease called gastroesophageal reflux disease (GERD). GERD occurs when the reflux: Happens often. Causes frequent or severe symptoms. Causes problems such as damage to the esophagus. When stomach acid comes in contact with the esophagus, the acid may cause inflammation in the esophagus. Over time, GERD may create small holes (ulcers) in the lining of the esophagus. What are the causes? This condition is caused by a problem with the muscle between the esophagus and the stomach (lower esophageal sphincter, or LES). Normally, the LES muscle closes after food passes through the esophagus to the stomach. When the LES is weakened or abnormal, it does not close properly, and that allows food and stomach acid to go back up into the esophagus. The LES can be weakened by  certain dietary substances, medicines, and medical conditions, including: Tobacco use. Pregnancy. Having a hiatal hernia. Alcohol use. Certain foods and beverages, such as coffee, chocolate, onions, and peppermint. What increases the risk? You are more likely to develop this condition if you: Have an increased body weight. Have a connective tissue disorder. Take NSAIDs, such as ibuprofen. What are the signs or symptoms? Symptoms of this condition include: Heartburn. Difficult or painful swallowing and the feeling of having a lump in the throat. A bitter taste in the mouth. Bad breath and having a large amount of saliva. Having an upset or bloated stomach and belching. Chest pain. Different conditions can cause chest pain. Make sure you see your health care provider if you experience chest pain. Shortness of breath or wheezing. Ongoing (chronic) cough or a nighttime cough. Wearing away of tooth enamel. Weight loss. How is this diagnosed? This condition may be diagnosed based on a medical history and a physical exam. To determine if you have mild or severe GERD, your health care provider may also monitor how you respond to treatment. You may also have tests, including: A test to examine your stomach and esophagus with a small camera (endoscopy). A test that measures the acidity level in your esophagus. A test that measures how much pressure is on your esophagus. A barium swallow or modified barium swallow test to show the shape, size, and functioning of your esophagus. How is this treated? Treatment for this condition may vary depending on how severe your symptoms are. Your health care provider may recommend: Changes to  your diet. Medicine. Surgery. The goal of treatment is to help relieve your symptoms and to prevent complications. Follow these instructions at home: Eating and drinking  Follow a diet as recommended by your health care provider. This may involve avoiding foods and  drinks such as: Coffee and tea, with or without caffeine. Drinks that contain alcohol. Energy drinks and sports drinks. Carbonated drinks or sodas. Chocolate and cocoa. Peppermint and mint flavorings. Garlic and onions. Horseradish. Spicy and acidic foods, including peppers, chili powder, curry powder, vinegar, hot sauces, and barbecue sauce. Citrus fruit juices and citrus fruits, such as oranges, lemons, and limes. Tomato-based foods, such as red sauce, chili, salsa, and pizza with red sauce. Fried and fatty foods, such as donuts, french fries, potato chips, and high-fat dressings. High-fat meats, such as hot dogs and fatty cuts of red and white meats, such as rib eye steak, sausage, ham, and bacon. High-fat dairy items, such as whole milk, butter, and cream cheese. Eat small, frequent meals instead of large meals. Avoid drinking large amounts of liquid with your meals. Avoid eating meals during the 2-3 hours before bedtime. Avoid lying down right after you eat. Do not exercise right after you eat. Lifestyle  Do not use any products that contain nicotine or tobacco. These products include cigarettes, chewing tobacco, and vaping devices, such as e-cigarettes. If you need help quitting, ask your health care provider. Try to reduce your stress by using methods such as yoga or meditation. If you need help reducing stress, ask your health care provider. If you are overweight, reduce your weight to an amount that is healthy for you. Ask your health care provider for guidance about a safe weight loss goal. General instructions Pay attention to any changes in your symptoms. Take over-the-counter and prescription medicines only as told by your health care provider. Do not take aspirin, ibuprofen, or other NSAIDs unless your health care provider told you to take these medicines. Wear loose-fitting clothing. Do not wear anything tight around your waist that causes pressure on your abdomen. Raise  (elevate) the head of your bed about 6 inches (15 cm). You can use a wedge to do this. Avoid bending over if this makes your symptoms worse. Keep all follow-up visits. This is important. Contact a health care provider if: You have: New symptoms. Unexplained weight loss. Difficulty swallowing or it hurts to swallow. Wheezing or a persistent cough. A hoarse voice. Your symptoms do not improve with treatment. Get help right away if: You have sudden pain in your arms, neck, jaw, teeth, or back. You suddenly feel sweaty, dizzy, or light-headed. You have chest pain or shortness of breath. You vomit and the vomit is green, yellow, or black, or it looks like blood or coffee grounds. You faint. You have stool that is red, bloody, or black. You cannot swallow, drink, or eat. These symptoms may represent a serious problem that is an emergency. Do not wait to see if the symptoms will go away. Get medical help right away. Call your local emergency services (911 in the [place] Do not drive yourself to the hospital. Summary Gastroesophageal reflux happens when acid from the stomach flows up into the esophagus. GERD is a disease in which the reflux happens often, causes frequent or severe symptoms, or causes problems such as damage to the esophagus. Treatment for this condition may vary depending on how severe your symptoms are. Your health care provider may recommend diet and lifestyle changes, medicine, or surgery. Contact a  health care provider if you have new or worsening symptoms. Take over-the-counter and prescription medicines only as told by your health care provider. Do not take aspirin, ibuprofen, or other NSAIDs unless your health care provider told you to do so. Keep all follow-up visits as told by your health care provider. This is important. This information is not intended to replace advice given to you by your health care provider. Make sure you discuss any questions you have with your  health care provider. Document Revised: 06/23/2020 Document Reviewed: 06/23/2020 Elsevier Patient Education  [place]. BMI:  If you are age 16 or older, your body mass index should be between 23-30. Your Body mass index is 28.21 kg/m. If this is out of the aforementioned range listed, please consider follow up with your Primary Care Provider.  If you are age 76 or younger, your body mass index should be between 19-25. Your Body mass index is 28.21 kg/m. If this is out of the aformentioned range listed, please consider follow up with your Primary Care Provider.   MY CHART:  The [facility] providers would like to encourage you to use [facility] to communicate with providers for non-urgent requests or questions.  Due to long hold times on the telephone, sending your provider a message by [facility] may be a faster and more efficient way to get a response.  Please allow 48 business hours for a response.  Please remember that this is for non-urgent requests.   Thank you for trusting me with your gastrointestinal care!

## 2022-02-19 NOTE — Progress Notes (Signed)
Reviewed.  Heidi Herendeen L. Tylasia Fletchall, MD, MPH  

## 2022-02-24 ENCOUNTER — Ambulatory Visit: Payer: PPO | Admitting: Gastroenterology

## 2022-03-04 ENCOUNTER — Encounter: Payer: Self-pay | Admitting: Gastroenterology

## 2022-03-04 ENCOUNTER — Ambulatory Visit (AMBULATORY_SURGERY_CENTER): Payer: Medicare Other | Admitting: Gastroenterology

## 2022-03-04 ENCOUNTER — Other Ambulatory Visit: Payer: Self-pay

## 2022-03-04 ENCOUNTER — Telehealth: Payer: Self-pay

## 2022-03-04 VITALS — BP 111/70 | HR 64 | Temp 98.2°F | Resp 16 | Ht 64.0 in | Wt 168.0 lb

## 2022-03-04 DIAGNOSIS — K2970 Gastritis, unspecified, without bleeding: Secondary | ICD-10-CM

## 2022-03-04 DIAGNOSIS — K319 Disease of stomach and duodenum, unspecified: Secondary | ICD-10-CM

## 2022-03-04 DIAGNOSIS — R1319 Other dysphagia: Secondary | ICD-10-CM | POA: Diagnosis not present

## 2022-03-04 DIAGNOSIS — K209 Esophagitis, unspecified without bleeding: Secondary | ICD-10-CM | POA: Diagnosis not present

## 2022-03-04 DIAGNOSIS — K2960 Other gastritis without bleeding: Secondary | ICD-10-CM

## 2022-03-04 DIAGNOSIS — K449 Diaphragmatic hernia without obstruction or gangrene: Secondary | ICD-10-CM | POA: Diagnosis not present

## 2022-03-04 DIAGNOSIS — K2100 Gastro-esophageal reflux disease with esophagitis, without bleeding: Secondary | ICD-10-CM | POA: Diagnosis not present

## 2022-03-04 DIAGNOSIS — R1310 Dysphagia, unspecified: Secondary | ICD-10-CM | POA: Diagnosis not present

## 2022-03-04 MED ORDER — OMEPRAZOLE 40 MG PO CPDR: 40.0000 mg | DELAYED_RELEASE_CAPSULE | Freq: Every day | ORAL | 0 refills | Status: DC

## 2022-03-04 MED ORDER — SODIUM CHLORIDE 0.9 % IV SOLN: 500.0000 mL | Freq: Once | INTRAVENOUS | Status: DC

## 2022-03-04 NOTE — Progress Notes (Signed)
Indications for procedure: Dysphagia ? ?Please see the office note from 02/18/22 for full details. There has been no change in history or physical exam. She remains an appropriate candidate for monitored anesthesia care in the [facility].  ?

## 2022-03-04 NOTE — Patient Instructions (Addendum)
Handouts were given to your care partner on Gastritis and a Hiatal Hernia. ?Increase taking OMEPRAZOLE to 40 mg 2 x per day for at least 10 weeks.  A new prescription was [facility] to [facility] on [address] ?AVOID ALL NSAIDs. ?You may resume your  other current medications today. ?Await biopsy results.  May take 1-3 weeks to receive pathology results. ?The office will call you to set up a follow up appointment. ?Please call if any questions or concerns. ?  ? ? ? ?YOU HAD AN ENDOSCOPIC PROCEDURE TODAY AT THE [facility]:   Refer to the procedure report that was given to you for any specific questions about what was found during the examination.  If the procedure report does not answer your questions, please call your gastroenterologist to clarify.  If you requested that your care partner not be given the details of your procedure findings, then the procedure report has been included in a sealed envelope for you to review at your convenience later. ? ?YOU SHOULD EXPECT: Some feelings of bloating in the abdomen. Passage of more gas than usual.  Walking can help get rid of the air that was put into your GI tract during the procedure and reduce the bloating. If you had a lower endoscopy (such as a colonoscopy or flexible sigmoidoscopy) you may notice spotting of blood in your stool or on the toilet paper. If you underwent a bowel prep for your procedure, you may not have a normal bowel movement for a few days. ? ?Please Note:  You might notice some irritation and congestion in your nose or some drainage.  This is from the oxygen used during your procedure.  There is no need for concern and it should clear up in a day or so. ? ?SYMPTOMS TO REPORT IMMEDIATELY: ? ? ?Following upper endoscopy (EGD) ? Vomiting of blood or coffee ground material ? New chest pain or pain under the shoulder blades ? Painful or persistently difficult swallowing ? New shortness of breath ? Fever of 100?F or higher ? Black,  tarry-looking stools ? ?For urgent or emergent issues, a gastroenterologist can be reached at any hour by calling [phone]. ?Do not use MyChart messaging for urgent concerns.  ? ? ?DIET:  We do recommend a small meal at first, but then you may proceed to your regular diet.  Drink plenty of fluids but you should avoid alcoholic beverages for 24 hours. ? ?ACTIVITY:  You should plan to take it easy for the rest of today and you should NOT DRIVE or use heavy machinery until tomorrow (because of the sedation medicines used during the test).   ? ?FOLLOW UP: ?Our staff will call the number listed on your records 48-72 hours following your procedure to check on you and address any questions or concerns that you may have regarding the information given to you following your procedure. If we do not reach you, we will leave a message.  We will attempt to reach you two times.  During this call, we will ask if you have developed any symptoms of COVID 19. If you develop any symptoms (ie: fever, flu-like symptoms, shortness of breath, cough etc.) before then, please call [phone].  If you test positive for Covid 19 in the 2 weeks post procedure, please call and report this information to [name].   ? ?If any biopsies were taken you will be contacted by phone or by letter within the next 1-3 weeks.  Please call  us at [phone] if you have not heard about the biopsies in 3 weeks.  ? ? ?SIGNATURES/CONFIDENTIALITY: ?You and/or your care partner have signed paperwork which will be entered into your electronic medical record.  These signatures attest to the fact that that the information above on your After Visit Summary has been reviewed and is understood.  Full responsibility of the confidentiality of this discharge information lies with you and/or your care-partner.  ?

## 2022-03-04 NOTE — Progress Notes (Signed)
VS by CW  Pt's states no medical or surgical changes since previsit or office visit.  

## 2022-03-04 NOTE — Telephone Encounter (Signed)
Per Dr. Orvan Falconer request, pt has been scheduled for 4-6 wk f/u with Quentin Mulling, PA-C on 04/16/22 @ 3pm. Appt reminder has been mailed. Appt will reflect on [facility] upon discharge for pt future reference. ? ?

## 2022-03-04 NOTE — Progress Notes (Signed)
Per Dr. Orvan Falconer, pt does not need to be on an esophageal dilatation diet today. ? ?No problems noted in the recovery room. maw  ? ?A prescription for OMEPRAZOLE 40 mg BID #180 No refill was sent to [facility] on [address]. ? ? ?

## 2022-03-04 NOTE — Telephone Encounter (Signed)
-----   Message from Tressia Danas, MD sent at 03/04/2022  8:56 AM EST ----- ?Please schedule office follow-up with Heidi Davenport in 4-6 weeks. Thanks. ? ?KLB ? ?

## 2022-03-04 NOTE — Op Note (Signed)
[facility] ?Patient Name: Heidi Davenport ?Procedure Date: 03/04/2022 8:37 AM ?MRN: [id number] ?Endoscopist: Tressia Danas MD, MD ?Age: 81 ?Referring MD:  ?Date of Birth: 1941-11-06 ?Gender: Female ?Account #: [id number] ?Procedure:                Upper GI endoscopy ?Indications:              Dysphagia ?Medicines:                Monitored Anesthesia Care ?Procedure:                Pre-Anesthesia Assessment: ?                          - Prior to the procedure, a History and Physical  ?                          was performed, and patient medications and  ?                          allergies were reviewed. The patient's tolerance of  ?                          previous anesthesia was also reviewed. The risks  ?                          and benefits of the procedure and the sedation  ?                          options and risks were discussed with the patient.  ?                          All questions were answered, and informed consent  ?                          was obtained. Prior Anticoagulants: The patient has  ?                          taken no previous anticoagulant or antiplatelet  ?                          agents. ASA Grade Assessment: III - A patient with  ?                          severe systemic disease. After reviewing the risks  ?                          and benefits, the patient was deemed in  ?                          satisfactory condition to undergo the procedure. ?                          After obtaining informed consent, the endoscope was  ?  passed under direct vision. Throughout the  ?                          procedure, the patient's blood pressure, pulse, and  ?                          oxygen saturations were monitored continuously. The  ?                          Endoscope was introduced through the mouth, and  ?                          advanced to the third part of duodenum. The upper  ?                          GI endoscopy was accomplished without  difficulty.  ?                          The patient tolerated the procedure well. ?Scope In: ?Scope Out: ?Findings:                 No endoscopic abnormality was evident in the  ?                          esophagus to explain the patient's complaint of  ?                          dysphagia except for LA Class A reflux esophagitis.  ?                          It was decided, however, to proceed with dilation  ?                          of the lower third of the esophagus. A TTS dilator  ?                          was passed through the scope. Dilation with a  ?                          16-17-18 mm balloon dilator was performed to 18 mm.  ?                          There was resistance to the fully inflated 18mm  ?                          balloon. The dilation site was examined and showed  ?                          no significant change. Biopsies were obtained from  ?                          the proximal and distal esophagus with cold forceps  ?  for histology of suspected eosinophilic esophagitis. ?                          Patchy mild inflammation characterized by erythema,  ?                          friability and granularity was found in the gastric  ?                          fundus and in the gastric body. Biopsies were taken  ?                          from the antrum, body, and fundus with a cold  ?                          forceps for histology. Estimated blood loss was  ?                          minimal. ?                          A small hiatal hernia was present. ?                          Diffuse mildly erythematous mucosa was found in the  ?                          duodenal bulb. ?Complications:            No immediate complications. Estimated blood loss:  ?                          Minimal. ?Estimated Blood Loss:     Estimated blood loss was minimal. ?Impression:               - No endoscopic esophageal abnormality to explain  ?                          patient's dysphagia.  Esophagus dilated. Dilated.  ?                          Biopsied. ?                          - Gastritis. Biopsied. ?                          - Small hiatal hernia. ?                          - Duodenitis. ?                          - The examination was otherwise normal. ?Recommendation:           - Patient has a contact number available for  ?  emergencies. The signs and symptoms of potential  ?                          delayed complications were discussed with the  ?                          patient. Return to normal activities tomorrow.  ?                          Written discharge instructions were provided to the  ?                          patient. ?                          - Resume previous diet. ?                          - Continue present medications. ?                          - Increase omeprazole to 40 mg BID for at least 10  ?                          weeks. ?                          - Avoid all NSAIDs. ?                          - Await pathology results. ?                          - I will arrange office follow-up. ?Tressia Danas MD, MD ?03/04/2022 9:01:33 AM ?This report has been signed electronically. ?

## 2022-03-04 NOTE — Progress Notes (Signed)
VSS, transported to PACU °

## 2022-03-04 NOTE — Progress Notes (Signed)
Called to room to assist during endoscopic procedure.  Patient ID and intended procedure confirmed with present staff. Received instructions for my participation in the procedure from the performing physician.  

## 2022-03-05 ENCOUNTER — Other Ambulatory Visit: Payer: Self-pay

## 2022-03-05 DIAGNOSIS — R1319 Other dysphagia: Secondary | ICD-10-CM

## 2022-03-05 DIAGNOSIS — K2970 Gastritis, unspecified, without bleeding: Secondary | ICD-10-CM

## 2022-03-05 MED ORDER — OMEPRAZOLE 40 MG PO CPDR: 40.0000 mg | DELAYED_RELEASE_CAPSULE | Freq: Every day | ORAL | 11 refills | Status: DC

## 2022-03-05 MED ORDER — OMEPRAZOLE 40 MG PO CPDR: 40.0000 mg | DELAYED_RELEASE_CAPSULE | Freq: Two times a day (BID) | ORAL | 0 refills | Status: DC

## 2022-03-08 ENCOUNTER — Telehealth: Payer: Self-pay

## 2022-03-08 NOTE — Telephone Encounter (Signed)
?  Follow up Call- ? ?Call back number 03/04/2022  ?Post procedure Call Back phone  # [phone]  ?Permission to leave phone message Yes  ?Some recent data might be hidden  ?  ? ?Patient questions: ? ?Do you have a fever, pain , or abdominal swelling? No. ?Pain Score  0 * ? ?Have you tolerated food without any problems? Yes.   ? ?Have you been able to return to your normal activities? Yes.   ? ?Do you have any questions about your discharge instructions: ?Diet   No. ?Medications  No. ?Follow up visit  No. ? ?Do you have questions or concerns about your Care? No. ? ?Actions: ?* If pain score is 4 or above: ?No action needed, pain <4. ? ?Have you developed a fever since your procedure? no ? ?2.   Have you had an respiratory symptoms (SOB or cough) since your procedure? no ? ?3.   Have you tested positive for COVID 19 since your procedure no ? ?4.   Have you had any family members/close contacts diagnosed with the COVID 19 since your procedure?  no ? ? ?If yes to any of these questions please route to Laverna Peace, RN and Karlton Lemon, RN ? ? ? ?

## 2022-03-14 ENCOUNTER — Encounter: Payer: Self-pay | Admitting: Gastroenterology

## 2022-03-24 DIAGNOSIS — M5136 Other intervertebral disc degeneration, lumbar region: Secondary | ICD-10-CM | POA: Diagnosis not present

## 2022-03-24 DIAGNOSIS — L400 Psoriasis vulgaris: Secondary | ICD-10-CM | POA: Diagnosis not present

## 2022-03-24 DIAGNOSIS — M810 Age-related osteoporosis without current pathological fracture: Secondary | ICD-10-CM | POA: Diagnosis not present

## 2022-03-24 DIAGNOSIS — L4059 Other psoriatic arthropathy: Secondary | ICD-10-CM | POA: Diagnosis not present

## 2022-04-14 NOTE — Progress Notes (Signed)
? ? ?04/16/2022 ?Heidi Davenport ?[id number] ?Jan 17, 1941 ? ?Referring provider: Cleatis Polka., MD ?Primary GI doctor: Dr. Orvan Falconer ? ?ASSESSMENT AND PLAN:  ? ?Esophageal dysphagia  ?S/p dilatation and doing very well.  ?If you have worsening swallowing call Heidi Davenport and we will set up for a barium swallow.  ? ?Gastritis without bleeding, unspecified chronicity, unspecified gastritis type ?Finish the twice a day prilosec 40 mg that you have ?Can the go to once a day prilosec 30 -60 mins before food.  ?Can take pepcid at night or increase back to twice a day as needed.  ?Lifestyle changes discussed, avoid NSAIDS ? ?Will follow up as needed per patient since moving to [place] ? ?History of Present Illness:  ?81 y.o. female  with a past medical history of asthma, psoriatic arthritis on methotrexate, anxiety, history of cholecystectomy  and others listed below, returns to clinic today for evaluation of gastritis. ?03/04/2022 endoscopy with Dr. Orvan Falconer showed no abnormality to explain dysphagia, esophagus dilated, gastritis, small hiatal hernia, duodenitis. ? ?She is moving to [place] but going to still come to [place] for follow up. ?Was on Prilosec once a day, increased to twice a day for 3 months.  ?She has no issues with her swallowing and stomach at this time.  ?She feels better.  ?No melena.  ?She is very happy with results.  ? ?EGD pathology: ?1. Surgical [P], gastric ?- REACTIVE GASTROPATHY. ?- NEGATIVE FOR HELICOBACTER PYLORI ORGANISMS (H. PYLORI IMMUNOHISTOCHEMICAL (IHC) STAIN WITH ?ADEQUATE CONTROL EXAMINED). ?- NEGATIVE FOR INTESTINAL METAPLASIA. ?- NEGATIVE FOR MALIGNANCY. ?2. Surgical [P], distal esophagus ?- ACUTE ESOPHAGITIS. ?- GMS STAIN NEGATIVE FOR FUNGI, WITH ADEQUATE CONTROL. ?- NEGATIVE HSV-I/HSV-II IMMUNOHISTOCHEMICAL (IHC) STAINS, WITH ADEQUATE CONTROLS. ?- NEGATIVE FOR DYSPLASIA AND MALIGNANCY. ?- NO GLANDULAR MUCOSA PRESENT. ?3. Surgical [P], mid/ proximal esophagus ?- SQUAMOUS MUCOSA WITH REACTIVE  FEATURES ASSOCIATED WITH REFLUX EFFECT (BASAL HYPERPLASIA AND ?ELONGATED PAPILLAE). ?- NEGATIVE FOR INTRASQUAMOUS EOSINOPHILS. ?- NEGATIVE FOR DYSPLASIA AND MALIGNANCY. ?- NO FUNGI OR VIRAL CYTOPATHIC CHANGE IDENTIFIED (ON H&E). ?- NO GLANDULAR MUCOSA PRESENT. ? ? ?Current Medications:  ? ? ?Current Outpatient Medications (Cardiovascular):  ?  lisinopril (PRINIVIL,ZESTRIL) 20 MG tablet, Take 20 mg by mouth daily. ? ?Current Outpatient Medications (Respiratory):  ?  albuterol (PROVENTIL HFA;VENTOLIN HFA) 108 (90 Base) MCG/ACT inhaler, Inhale 2 puffs into the lungs every 6 (six) hours as needed for wheezing or shortness of breath. ?  fexofenadine (ALLEGRA) 180 MG tablet, Take 180 mg by mouth daily. ?  fluticasone (FLONASE) 50 MCG/ACT nasal spray, Place 1 spray into both nostrils daily as needed for allergies or rhinitis. ? ?Current Outpatient Medications (Analgesics):  ?  ibuprofen (ADVIL,MOTRIN) 200 MG tablet, Take 200 mg by mouth as needed for moderate pain.  ? ?Current Outpatient Medications (Hematological):  ?  folic acid (FOLVITE) 800 MCG tablet, Take 800 mcg by mouth daily. ? ?Current Outpatient Medications (Other):  ?  buPROPion (WELLBUTRIN XL) 150 MG 24 hr tablet, Take 1 tablet by mouth daily. ?  calcium carbonate (OSCAL) 1500 (600 Ca) MG TABS tablet, Take 1 tablet by mouth daily. ?  cholecalciferol (VITAMIN D) 1000 units tablet, Take 2,000 Units by mouth daily. ?  diazepam (VALIUM) 10 MG tablet, Take 10 mg by mouth daily as needed. ?  methotrexate (RHEUMATREX) 2.5 MG tablet, Take 5-7.5 mg by mouth as directed. Caution:Chemotherapy. Protect from light. Takes 3 tablets on Friday, 2 tablets on Saturday  ?  Multiple Vitamin (MULTIVITAMIN WITH MINERALS) TABS tablet, Take 1 tablet by  mouth daily. ?  Multiple Vitamins-Minerals (PRESERVISION AREDS 2+MULTI VIT PO), Take 1 tablet by mouth 2 (two) times daily. ?  omeprazole (PRILOSEC) 40 MG capsule, Take 1 capsule (40 mg total) by mouth 2 (two) times daily. ?  potassium  chloride SA (K-DUR,KLOR-CON) 20 MEQ tablet, Take 20 mEq by mouth daily. ?  rOPINIRole (REQUIP) 0.5 MG tablet, Take 0.5 mg by mouth at bedtime. ?  sertraline (ZOLOFT) 100 MG tablet, Take 100 mg by mouth daily. ? ?Surgical History:  ?She  has a past surgical history that includes Cataract extraction; Tonsillectomy; Tubal ligation; Knee surgery; Cholecystectomy; and Abdominal hysterectomy. ?Family History:  ?Her family history includes Dementia in her sister; Diabetes in her daughter and sister; Heart attack in her daughter and sister; Lung disease in her brother; Parkinson's disease in her father; Psoriasis in her father; Rheum arthritis in her sister; Splenomegaly in her daughter and son; Throat cancer in her mother. ?Social History:  ? reports that she has never smoked. She has never used smokeless tobacco. She reports that she does not drink alcohol and does not use drugs. ? ?Current Medications, Allergies, Past Medical History, Past Surgical History, Family History and Social History were reviewed in [employer] record. ? ?Physical Exam: ?BP (!) 142/84   Pulse 70   Ht 5\' 4"  (1.626 m)   Wt 169 lb (76.7 kg)   SpO2 96%   BMI 29.01 kg/m?  ?General:   Pleasant, well developed female in no acute distress ?Heart:  Regular rate and rhythm; no murmurs ?Pulm: Clear anteriorly; no wheezing ?Abdomen:  Soft, Obese AB, Active bowel sounds. No tenderness . ,  ?Extremities:  without  edema. ?Neurologic:  Alert and  oriented x4;  No focal deficits. Walk with cane.  ?Psych:  Cooperative. Normal mood and affect. ? ? ? , PA-C ?04/16/22 ?

## 2022-04-16 ENCOUNTER — Encounter: Payer: Self-pay | Admitting: Physician Assistant

## 2022-04-16 ENCOUNTER — Ambulatory Visit: Payer: Medicare Other | Admitting: Physician Assistant

## 2022-04-16 VITALS — BP 142/84 | HR 70 | Ht 64.0 in | Wt 169.0 lb

## 2022-04-16 DIAGNOSIS — K2970 Gastritis, unspecified, without bleeding: Secondary | ICD-10-CM | POA: Diagnosis not present

## 2022-04-16 DIAGNOSIS — R1319 Other dysphagia: Secondary | ICD-10-CM | POA: Diagnosis not present

## 2022-04-16 NOTE — Progress Notes (Signed)
Reviewed.  Geanette Buonocore L. Rayshawn Visconti, MD, MPH  

## 2022-04-16 NOTE — Patient Instructions (Addendum)
? ?If you have worsening swallowing call us and we will set up for a barium swallow.  ? ?Avoid spicy and acidic foods ?Avoid fatty foods ?Limit your intake of coffee, tea, alcohol, and carbonated drinks ?Work to maintain a healthy weight ?Keep the head of the bed elevated at least 3 inches with blocks or a wedge pillow if you are having any nighttime symptoms ?Stay upright for 2 hours after eating ?Avoid meals and snacks three to four hours before bedtime ? ? ?Gastroesophageal Reflux Disease, Adult ?Gastroesophageal reflux (GER) happens when acid from the stomach flows up into the tube that connects the mouth and the stomach (esophagus). Normally, food travels down the esophagus and stays in the stomach to be digested. However, when a person has GER, food and stomach acid sometimes move back up into the esophagus. If this becomes a more serious problem, the person may be diagnosed with a disease called gastroesophageal reflux disease (GERD). GERD occurs when the reflux: ?Happens often. ?Causes frequent or severe symptoms. ?Causes problems such as damage to the esophagus. ?When stomach acid comes in contact with the esophagus, the acid may cause inflammation in the esophagus. Over time, GERD may create small holes (ulcers) in the lining of the esophagus. ?What are the causes? ?This condition is caused by a problem with the muscle between the esophagus and the stomach (lower esophageal sphincter, or LES). Normally, the LES muscle closes after food passes through the esophagus to the stomach. When the LES is weakened or abnormal, it does not close properly, and that allows food and stomach acid to go back up into the esophagus. ?The LES can be weakened by certain dietary substances, medicines, and medical conditions, including: ?Tobacco use. ?Pregnancy. ?Having a hiatal hernia. ?Alcohol use. ?Certain foods and beverages, such as coffee, chocolate, onions, and peppermint. ?What increases the risk? ?You are more likely  to develop this condition if you: ?Have an increased body weight. ?Have a connective tissue disorder. ?Take NSAIDs, such as ibuprofen. ?What are the signs or symptoms? ?Symptoms of this condition include: ?Heartburn. ?Difficult or painful swallowing and the feeling of having a lump in the throat. ?A bitter taste in the mouth. ?Bad breath and having a large amount of saliva. ?Having an upset or bloated stomach and belching. ?Chest pain. Different conditions can cause chest pain. Make sure you see your health care provider if you experience chest pain. ?Shortness of breath or wheezing. ?Ongoing (chronic) cough or a nighttime cough. ?Wearing away of tooth enamel. ?Weight loss. ?How is this diagnosed? ?This condition may be diagnosed based on a medical history and a physical exam. To determine if you have mild or severe GERD, your health care provider may also monitor how you respond to treatment. You may also have tests, including: ?A test to examine your stomach and esophagus with a small camera (endoscopy). ?A test that measures the acidity level in your esophagus. ?A test that measures how much pressure is on your esophagus. ?A barium swallow or modified barium swallow test to show the shape, size, and functioning of your esophagus. ?How is this treated? ?Treatment for this condition may vary depending on how severe your symptoms are. Your health care provider may recommend: ?Changes to your diet. ?Medicine. ?Surgery. ?The goal of treatment is to help relieve your symptoms and to prevent complications. ?Follow these instructions at home: ?Eating and drinking ? ?Follow a diet as recommended by your health care provider. This may involve avoiding foods and drinks such as: ?  Coffee and tea, with or without caffeine. ?Drinks that contain alcohol. ?Energy drinks and sports drinks. ?Carbonated drinks or sodas. ?Chocolate and cocoa. ?Peppermint and mint flavorings. ?Garlic and onions. ?Horseradish. ?Spicy and acidic foods,  including peppers, chili powder, curry powder, vinegar, hot sauces, and barbecue sauce. ?Citrus fruit juices and citrus fruits, such as oranges, lemons, and limes. ?Tomato-based foods, such as red sauce, chili, salsa, and pizza with red sauce. ?Fried and fatty foods, such as donuts, french fries, potato chips, and high-fat dressings. ?High-fat meats, such as hot dogs and fatty cuts of red and white meats, such as rib eye steak, sausage, ham, and bacon. ?High-fat dairy items, such as whole milk, butter, and cream cheese. ?Eat small, frequent meals instead of large meals. ?Avoid drinking large amounts of liquid with your meals. ?Avoid eating meals during the 2-3 hours before bedtime. ?Avoid lying down right after you eat. ?Do not exercise right after you eat. ?Lifestyle ? ?Do not use any products that contain nicotine or tobacco. These products include cigarettes, chewing tobacco, and vaping devices, such as e-cigarettes. If you need help quitting, ask your health care provider. ?Try to reduce your stress by using methods such as yoga or meditation. If you need help reducing stress, ask your health care provider. ?If you are overweight, reduce your weight to an amount that is healthy for you. Ask your health care provider for guidance about a safe weight loss goal. ?General instructions ?Pay attention to any changes in your symptoms. ?Take over-the-counter and prescription medicines only as told by your health care provider. Do not take aspirin, ibuprofen, or other NSAIDs unless your health care provider told you to take these medicines. ?Wear loose-fitting clothing. Do not wear anything tight around your waist that causes pressure on your abdomen. ?Raise (elevate) the head of your bed about 6 inches (15 cm). You can use a wedge to do this. ?Avoid bending over if this makes your symptoms worse. ?Keep all follow-up visits. This is important. ?Contact a health care provider if: ?You have: ?New symptoms. ?Unexplained  weight loss. ?Difficulty swallowing or it hurts to swallow. ?Wheezing or a persistent cough. ?A hoarse voice. ?Your symptoms do not improve with treatment. ?Get help right away if: ?You have sudden pain in your arms, neck, jaw, teeth, or back. ?You suddenly feel sweaty, dizzy, or light-headed. ?You have chest pain or shortness of breath. ?You vomit and the vomit is green, yellow, or black, or it looks like blood or coffee grounds. ?You faint. ?You have stool that is red, bloody, or black. ?You cannot swallow, drink, or eat. ?These symptoms may represent a serious problem that is an emergency. Do not wait to see if the symptoms will go away. Get medical help right away. Call your local emergency services (911 in the [place] Do not drive yourself to the hospital. ?Summary ?Gastroesophageal reflux happens when acid from the stomach flows up into the esophagus. GERD is a disease in which the reflux happens often, causes frequent or severe symptoms, or causes problems such as damage to the esophagus. ?Treatment for this condition may vary depending on how severe your symptoms are. Your health care provider may recommend diet and lifestyle changes, medicine, or surgery. ?Contact a health care provider if you have new or worsening symptoms. ?Take over-the-counter and prescription medicines only as told by your health care provider. Do not take aspirin, ibuprofen, or other NSAIDs unless your health care provider told you to do so. ?Keep all follow-up visits as  told by your health care provider. This is important. ?This information is not intended to replace advice given to you by your health care provider. Make sure you discuss any questions you have with your health care provider. ?Document Revised: 06/23/2020 Document Reviewed: 06/23/2020 ?Elsevier Patient Education ? Agency. ? ?

## 2022-05-21 ENCOUNTER — Other Ambulatory Visit: Payer: Self-pay | Admitting: Gastroenterology

## 2022-05-21 DIAGNOSIS — R1319 Other dysphagia: Secondary | ICD-10-CM

## 2022-05-21 DIAGNOSIS — K2970 Gastritis, unspecified, without bleeding: Secondary | ICD-10-CM

## 2022-06-24 DIAGNOSIS — L4059 Other psoriatic arthropathy: Secondary | ICD-10-CM | POA: Diagnosis not present

## 2022-06-24 DIAGNOSIS — Z79899 Other long term (current) drug therapy: Secondary | ICD-10-CM | POA: Diagnosis not present

## 2022-08-27 DIAGNOSIS — I10 Essential (primary) hypertension: Secondary | ICD-10-CM | POA: Diagnosis not present

## 2022-08-27 DIAGNOSIS — M810 Age-related osteoporosis without current pathological fracture: Secondary | ICD-10-CM | POA: Diagnosis not present

## 2022-08-27 DIAGNOSIS — R7301 Impaired fasting glucose: Secondary | ICD-10-CM | POA: Diagnosis not present

## 2022-08-27 DIAGNOSIS — E785 Hyperlipidemia, unspecified: Secondary | ICD-10-CM | POA: Diagnosis not present

## 2022-09-01 DIAGNOSIS — R82998 Other abnormal findings in urine: Secondary | ICD-10-CM | POA: Diagnosis not present

## 2022-09-30 DIAGNOSIS — L4059 Other psoriatic arthropathy: Secondary | ICD-10-CM | POA: Diagnosis not present

## 2022-09-30 DIAGNOSIS — Z79899 Other long term (current) drug therapy: Secondary | ICD-10-CM | POA: Diagnosis not present

## 2022-11-14 ENCOUNTER — Other Ambulatory Visit: Payer: Self-pay | Admitting: Gastroenterology

## 2022-11-14 DIAGNOSIS — K2970 Gastritis, unspecified, without bleeding: Secondary | ICD-10-CM

## 2022-11-14 DIAGNOSIS — R1319 Other dysphagia: Secondary | ICD-10-CM

## 2023-04-28 DIAGNOSIS — M1991 Primary osteoarthritis, unspecified site: Secondary | ICD-10-CM | POA: Diagnosis not present

## 2023-04-28 DIAGNOSIS — M25561 Pain in right knee: Secondary | ICD-10-CM | POA: Diagnosis not present

## 2023-04-28 DIAGNOSIS — L400 Psoriasis vulgaris: Secondary | ICD-10-CM | POA: Diagnosis not present

## 2023-04-28 DIAGNOSIS — M5136 Other intervertebral disc degeneration, lumbar region: Secondary | ICD-10-CM | POA: Diagnosis not present

## 2023-04-28 DIAGNOSIS — L4059 Other psoriatic arthropathy: Secondary | ICD-10-CM | POA: Diagnosis not present

## 2023-04-28 DIAGNOSIS — E663 Overweight: Secondary | ICD-10-CM | POA: Diagnosis not present

## 2023-04-28 DIAGNOSIS — M810 Age-related osteoporosis without current pathological fracture: Secondary | ICD-10-CM | POA: Diagnosis not present

## 2023-04-28 DIAGNOSIS — M25562 Pain in left knee: Secondary | ICD-10-CM | POA: Diagnosis not present

## 2023-04-28 DIAGNOSIS — Z6825 Body mass index (BMI) 25.0-25.9, adult: Secondary | ICD-10-CM | POA: Diagnosis not present

## 2023-05-26 ENCOUNTER — Other Ambulatory Visit: Payer: Self-pay | Admitting: Orthopedic Surgery

## 2023-05-26 ENCOUNTER — Encounter: Payer: Self-pay | Admitting: Orthopedic Surgery

## 2023-05-26 DIAGNOSIS — W010XXA Fall on same level from slipping, tripping and stumbling without subsequent striking against object, initial encounter: Secondary | ICD-10-CM | POA: Diagnosis not present

## 2023-05-26 DIAGNOSIS — S52501A Unspecified fracture of the lower end of right radius, initial encounter for closed fracture: Secondary | ICD-10-CM | POA: Diagnosis not present

## 2023-05-26 DIAGNOSIS — Y92009 Unspecified place in unspecified non-institutional (private) residence as the place of occurrence of the external cause: Secondary | ICD-10-CM | POA: Diagnosis not present

## 2023-05-26 DIAGNOSIS — S6991XA Unspecified injury of right wrist, hand and finger(s), initial encounter: Secondary | ICD-10-CM | POA: Diagnosis not present

## 2023-05-26 DIAGNOSIS — S52531A Colles' fracture of right radius, initial encounter for closed fracture: Secondary | ICD-10-CM | POA: Diagnosis not present

## 2023-05-26 DIAGNOSIS — Z8739 Personal history of other diseases of the musculoskeletal system and connective tissue: Secondary | ICD-10-CM | POA: Diagnosis not present

## 2023-05-26 NOTE — Anesthesia Preprocedure Evaluation (Addendum)
Anesthesia Evaluation  Patient identified by MRN, date of birth, ID band Patient awake    Reviewed: Allergy & Precautions, H&P , NPO status , Patient's Chart, lab work & pertinent test results  Airway Mallampati: III  TM Distance: <3 FB Neck ROM: Full  Mouth opening: Limited Mouth Opening  Dental no notable dental hx. (+) Upper Dentures, Missing States some of her teeth "came out when they stretched my esophagus and I bit on something":   Pulmonary neg pulmonary ROS, asthma    Pulmonary exam normal breath sounds clear to auscultation       Cardiovascular hypertension, Normal cardiovascular exam Rhythm:Regular Rate:Normal     Neuro/Psych   Anxiety      Neuromuscular disease negative neurological ROS  negative psych ROS   GI/Hepatic negative GI ROS, Neg liver ROS,GERD  ,,Esophageal dysmotility Chronic gastritis    Endo/Other  negative endocrine ROS    Renal/GU Renal diseasenegative Renal ROS  negative genitourinary   Musculoskeletal negative musculoskeletal ROS (+) Arthritis ,    Abdominal   Peds negative pediatric ROS (+)  Hematology negative hematology ROS (+)   Anesthesia Other Findings Allergy  Anxiety Arthritis  Asthma Psoriasis  GERD (gastroesophageal reflux disease) Chronic kidney disease  Hypertension Slipped cervical disc  Neuromuscular disorder (HCC) Wears dentures  Restless leg syndrome CAD  Check level of asthma   Reproductive/Obstetrics negative OB ROS                             Anesthesia Physical Anesthesia Plan  ASA: 3  Anesthesia Plan: General ETT   Post-op Pain Management:    Induction: Intravenous  PONV Risk Score and Plan:   Airway Management Planned: Oral ETT  Additional Equipment:   Intra-op Plan:   Post-operative Plan: Extubation in OR  Informed Consent: I have reviewed the patients History and Physical, chart, labs and discussed the  procedure including the risks, benefits and alternatives for the proposed anesthesia with the patient or authorized representative who has indicated his/her understanding and acceptance.     Dental Advisory Given  Plan Discussed with: Anesthesiologist, CRNA and Surgeon  Anesthesia Plan Comments: (Patient consented for risks of anesthesia including but not limited to:  - adverse reactions to medications - damage to eyes, teeth, lips or other oral mucosa - nerve damage due to positioning  - sore throat or hoarseness - Damage to heart, brain, nerves, lungs, other parts of body or loss of life  Patient voiced understanding.)        Anesthesia Quick Evaluation

## 2023-05-30 NOTE — H&P (Signed)
Chief Complaint Patient presents with Right Wrist - Pain   History of the Present Illness: Heidi Davenport is a 82 y.o. female here today who suffered a right wrist fracture today. She came into our walk-in clinic. This is her initial evaluation of this fracture with [name]. She is accompanied by an adult female and female.  The patient experienced a fall in her residence, however, she did not lose consciousness or sustain a head injury. She lives and drives independently maintaining a functional lifestyle. The accompanying adult female queries the potential impact of mobility postoperatively. The patient also reports stiffness in her fingers. She is not currently on any anticoagulant therapy.  She has a history of a previous wrist fracture.  Her primary care physician is Dr. Buren Kos in [place].  I have reviewed past medical, surgical, social and family history, and allergies as documented in the EMR.  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 No pertinent family history.  Medications: Current Outpatient Medications Medication Sig Dispense Refill albuterol MDI, PROVENTIL, VENTOLIN, PROAIR, HFA 90 mcg/actuation inhaler Inhale 2 Inhalations into the lungs every 6 (six) hours as needed buPROPion (WELLBUTRIN XL) 150 MG XL tablet Take 1 tablet by mouth once daily cholecalciferol (VITAMIN D3) 1000 unit tablet Take 2,000 Units by mouth once daily diazePAM (VALIUM) 10 MG tablet Take 10 mg by mouth once  daily as needed fexofenadine (ALLEGRA) 180 MG tablet Take 180 mg by mouth once daily fluticasone propionate (FLONASE) 50 mcg/actuation nasal spray Place 1 spray into one nostril once daily as needed folic acid (FOLVITE) 800 MCG tablet Take by mouth lisinopriL (ZESTRIL) 20 MG tablet methotrexate (RHEUMATREX) 2.5 MG tablet Take by mouth omeprazole (PRILOSEC) 40 MG DR capsule Take 1 capsule by mouth 2 (two) times daily rOPINIRole (REQUIP) 0.5 MG tablet Take 0.5 mg by mouth at bedtime  No current facility-administered medications for this visit.  Allergies: Allergies Allergen Reactions Venom-Honey Bee Other (See Comments) and Swelling dizziness Codeine Nausea and Other (See Comments) Headaches Penicillins Nausea and Other (See Comments) Headaches- pt unsure if she if truly allergic Has patient had a PCN reaction causing immediate rash, facial/tongue/throat swelling, SOB or lightheadedness with hypotension: no Has patient had a PCN reaction causing severe rash involving mucus membranes or skin necrosis: no Has patient had a PCN reaction that required hospitalization: no Has patient had a PCN reaction occurring within the last 10 years: no If all of the above answers are "NO", then may proceed with Cephalosporin use.   Body mass index is 27.63 kg/m.  Review of Systems: A comprehensive 14 point ROS was performed, reviewed, and the pertinent orthopaedic findings are documented in the HPI.  There were no vitals filed for this visit.  General Physical Examination:  General/Constitutional: No apparent distress: well-nourished and well developed. Eyes: Pupils equal, round with synchronous movement. Lungs: Clear to auscultation HEENT: Normal Vascular: No edema, swelling or tenderness, except as noted in detailed exam. Cardiac: Heart rate and rhythm is regular. Integumentary: No impressive skin lesions present, except as noted in detailed exam. Neuro/Psych: Normal mood and affect,  oriented to person, place, and time.  Radiographs:  No new imaging studies were obtained or reviewed today.  Assessment:  ICD-10-CM 1. Closed Colles' fracture of right radius, initial encounter S52.531A  Plan: The patient has clinical findings of a dorsally displaced and shortened distal radius fracture, in a high-functioning and independent 82 year old. The proposed recommendation involves an open reduction and internal fixation (ORIF) that will be scheduled for Friday, 05/27/2023 or next week.  Operative options were discussed with the patient. The patient elected for surgical intervention. The patient was educated on risks and benefits, including but not limited to infection, nerve injury, vascular injury, wound healing issues, persistent pain, and the need for additional procedures. Informed consent was obtained. The postoperative protocol was reviewed as well. Follow up 10-14 days after surgery.  The fracture will be treated via open reduction and internal fixation (ORIF).  The patient was instructed as to activity limitations and restrictions as well as indications for immediate follow-up should there be any pertinent signs of worsening of the condition or pain, complications regarding dressing, cast, or splint care, warranting immediate evaluation as indicated. In the event that narcotic medications have been prescribed, the patient has been advised as to the potential adverse effects of their use and potential abuse and has been advised not to drive while using narcotic analgesic medications. The patient has been encouraged to contact the office or the on-call physician with any questions or concerns regarding the treatment plan.   Document Attestation: Eilene Ghazi, have reviewed and updated documentation for [facility], MD, utilizing [employer].  Electronically signed by Marlena Clipper, MD at 05/27/2023 8:16 AM EDT   Reviewed  H+P. No changes noted.

## 2023-05-31 ENCOUNTER — Other Ambulatory Visit: Payer: Self-pay

## 2023-05-31 ENCOUNTER — Encounter: Payer: Self-pay | Admitting: Orthopedic Surgery

## 2023-05-31 ENCOUNTER — Ambulatory Visit
Admission: RE | Admit: 2023-05-31 | Discharge: 2023-05-31 | Disposition: A | Discharge status: Home / Self Care | Payer: Medicare HMO | Source: Ambulatory Visit | Attending: Orthopedic Surgery | Admitting: Orthopedic Surgery

## 2023-05-31 ENCOUNTER — Ambulatory Visit: Payer: Self-pay

## 2023-05-31 ENCOUNTER — Ambulatory Visit: Payer: Medicare HMO | Admitting: Anesthesiology

## 2023-05-31 ENCOUNTER — Encounter
Admission: RE | Disposition: A | Discharge status: Home / Self Care | Payer: Self-pay | Source: Ambulatory Visit | Attending: Orthopedic Surgery

## 2023-05-31 DIAGNOSIS — N189 Chronic kidney disease, unspecified: Secondary | ICD-10-CM | POA: Diagnosis not present

## 2023-05-31 DIAGNOSIS — K219 Gastro-esophageal reflux disease without esophagitis: Secondary | ICD-10-CM | POA: Insufficient documentation

## 2023-05-31 DIAGNOSIS — I10 Essential (primary) hypertension: Secondary | ICD-10-CM | POA: Insufficient documentation

## 2023-05-31 DIAGNOSIS — F419 Anxiety disorder, unspecified: Secondary | ICD-10-CM | POA: Insufficient documentation

## 2023-05-31 DIAGNOSIS — S52531A Colles' fracture of right radius, initial encounter for closed fracture: Secondary | ICD-10-CM | POA: Diagnosis not present

## 2023-05-31 DIAGNOSIS — W19XXXA Unspecified fall, initial encounter: Secondary | ICD-10-CM | POA: Insufficient documentation

## 2023-05-31 DIAGNOSIS — G709 Myoneural disorder, unspecified: Secondary | ICD-10-CM | POA: Insufficient documentation

## 2023-05-31 DIAGNOSIS — J45909 Unspecified asthma, uncomplicated: Secondary | ICD-10-CM | POA: Diagnosis not present

## 2023-05-31 DIAGNOSIS — I2510 Atherosclerotic heart disease of native coronary artery without angina pectoris: Secondary | ICD-10-CM | POA: Diagnosis not present

## 2023-05-31 DIAGNOSIS — G8918 Other acute postprocedural pain: Secondary | ICD-10-CM | POA: Diagnosis not present

## 2023-05-31 DIAGNOSIS — M1990 Unspecified osteoarthritis, unspecified site: Secondary | ICD-10-CM | POA: Insufficient documentation

## 2023-05-31 DIAGNOSIS — I129 Hypertensive chronic kidney disease with stage 1 through stage 4 chronic kidney disease, or unspecified chronic kidney disease: Secondary | ICD-10-CM | POA: Diagnosis not present

## 2023-05-31 HISTORY — DX: Dyskinesia of esophagus: K22.4

## 2023-05-31 HISTORY — DX: Presence of dental prosthetic device (complete) (partial): Z97.2

## 2023-05-31 HISTORY — PX: OPEN REDUCTION INTERNAL FIXATION (ORIF) DISTAL RADIAL FRACTURE: SHX5989

## 2023-05-31 HISTORY — DX: Other psoriatic arthropathy: L40.59

## 2023-05-31 HISTORY — DX: Restless legs syndrome: G25.81

## 2023-05-31 SURGERY — OPEN REDUCTION INTERNAL FIXATION (ORIF) DISTAL RADIUS FRACTURE
Anesthesia: General | Site: Arm Lower | Laterality: Right

## 2023-05-31 MED ORDER — LIDOCAINE HCL (PF) 1 % IJ SOLN: INTRAMUSCULAR | Status: DC | PRN

## 2023-05-31 MED ORDER — 0.9 % SODIUM CHLORIDE (POUR BTL) OPTIME
TOPICAL | Status: DC | PRN
  Administered 2023-05-31: 500 mL

## 2023-05-31 MED ORDER — VASOPRESSIN 20 UNIT/ML IV SOLN
INTRAVENOUS | Status: DC | PRN
  Administered 2023-05-31 (×2): 1 [IU] via INTRAVENOUS

## 2023-05-31 MED ORDER — FENTANYL CITRATE (PF) 100 MCG/2ML IJ SOLN
INTRAMUSCULAR | Status: DC | PRN
  Administered 2023-05-31: 100 ug via INTRAVENOUS

## 2023-05-31 MED ORDER — EPHEDRINE SULFATE (PRESSORS) 50 MG/ML IJ SOLN
INTRAMUSCULAR | Status: DC | PRN
  Administered 2023-05-31: 10 mg via INTRAVENOUS

## 2023-05-31 MED ORDER — GLYCOPYRROLATE 0.2 MG/ML IJ SOLN
INTRAMUSCULAR | Status: DC | PRN
  Administered 2023-05-31 (×2): .2 mg via INTRAVENOUS

## 2023-05-31 MED ORDER — DEXAMETHASONE SODIUM PHOSPHATE 10 MG/ML IJ SOLN
INTRAMUSCULAR | Status: DC | PRN
  Administered 2023-05-31: 10 mg

## 2023-05-31 MED ORDER — LIDOCAINE HCL (CARDIAC) PF 100 MG/5ML IV SOSY: PREFILLED_SYRINGE | INTRAVENOUS | Status: DC | PRN

## 2023-05-31 MED ORDER — ROPIVACAINE HCL 5 MG/ML IJ SOLN
INTRAMUSCULAR | Status: DC | PRN
  Administered 2023-05-31: 30 mL via PERINEURAL

## 2023-05-31 MED ORDER — PROPOFOL 10 MG/ML IV BOLUS
INTRAVENOUS | Status: DC | PRN
  Administered 2023-05-31: 150 mg via INTRAVENOUS

## 2023-05-31 MED ORDER — CEFAZOLIN SODIUM-DEXTROSE 2-4 GM/100ML-% IV SOLN
2.0000 g | INTRAVENOUS | Status: AC
  Administered 2023-05-31: 2 g via INTRAVENOUS

## 2023-05-31 MED ORDER — LACTATED RINGERS IV SOLN: INTRAVENOUS | Status: DC

## 2023-05-31 MED ORDER — MIDAZOLAM HCL 2 MG/2ML IJ SOLN
2.0000 mg | Freq: Once | INTRAMUSCULAR | Status: AC
  Administered 2023-05-31: 2 mg via INTRAVENOUS

## 2023-05-31 MED ORDER — LIDOCAINE HCL (PF) 1 % IJ SOLN
INTRAMUSCULAR | Status: DC | PRN
  Administered 2023-05-31: 1 mL via INTRADERMAL

## 2023-05-31 MED ORDER — HYDROCODONE-ACETAMINOPHEN 5-325 MG PO TABS: 1.0000 | ORAL_TABLET | ORAL | 0 refills | Status: AC | PRN

## 2023-05-31 MED ORDER — LIDOCAINE HCL (CARDIAC) PF 100 MG/5ML IV SOSY
PREFILLED_SYRINGE | INTRAVENOUS | Status: DC | PRN
  Administered 2023-05-31: 100 mg via INTRATRACHEAL

## 2023-05-31 SURGICAL SUPPLY — 37 items
APL PRP STRL LF DISP 70% ISPRP (MISCELLANEOUS) ×1
BNDG CMPR STD VLCR NS LF 5.8X4 (GAUZE/BANDAGES/DRESSINGS) ×1
BNDG ELASTIC 4X5.8 VLCR NS LF (GAUZE/BANDAGES/DRESSINGS) ×2 IMPLANT
CHLORAPREP W/TINT 26 (MISCELLANEOUS) ×2 IMPLANT
COVER LIGHT HANDLE UNIVERSAL (MISCELLANEOUS) ×4 IMPLANT
CUFF TOURN SGL QUICK 18X4 (TOURNIQUET CUFF) IMPLANT
DRAPE FLUOR MINI C-ARM 54X84 (DRAPES) ×2 IMPLANT
ELECT REM PT RETURN 9FT ADLT (ELECTROSURGICAL) ×1
ELECTRODE REM PT RTRN 9FT ADLT (ELECTROSURGICAL) ×2 IMPLANT
GAUZE SPONGE 4X4 12PLY STRL (GAUZE/BANDAGES/DRESSINGS) ×2 IMPLANT
GAUZE XEROFORM 1X8 LF (GAUZE/BANDAGES/DRESSINGS) ×4 IMPLANT
GLOVE SURG SYN 9.0 PF PI (GLOVE) ×2 IMPLANT
GOWN STRL REIN 2XL XLG LVL4 (GOWN DISPOSABLE) ×2 IMPLANT
GOWN STRL REUS W/ TWL LRG LVL3 (GOWN DISPOSABLE) ×2 IMPLANT
GOWN STRL REUS W/TWL LRG LVL3 (GOWN DISPOSABLE) ×1
K-WIRE 1.6 (WIRE) ×1
K-WIRE FX5X1.6XNS BN SS (WIRE) ×1
KIT TURNOVER KIT A (KITS) ×2 IMPLANT
KWIRE FX5X1.6XNS BN SS (WIRE) IMPLANT
NS IRRIG 500ML POUR BTL (IV SOLUTION) ×2 IMPLANT
PACK EXTREMITY ARMC (MISCELLANEOUS) ×2 IMPLANT
PADDING CAST BLEND 4X4 STRL (MISCELLANEOUS) ×4 IMPLANT
PEG SUBCHONDRAL SMOOTH 2.0X14 (Peg) IMPLANT
PEG SUBCHONDRAL SMOOTH 2.0X16 (Peg) IMPLANT
PEG SUBCHONDRAL SMOOTH 2.0X18 (Peg) IMPLANT
PEG SUBCHONDRAL SMOOTH 2.0X20 (Peg) IMPLANT
PEG SUBCHONDRAL SMOOTH 2.0X22 (Peg) IMPLANT
PLATE SHORT 21.6X48.9 NRRW RT (Plate) IMPLANT
SCREW BN 12X3.5XNS CORT TI (Screw) IMPLANT
SCREW CORT 3.5X10 LNG (Screw) IMPLANT
SCREW CORT 3.5X12 (Screw) ×2 IMPLANT
SPLINT CAST 1 STEP 3X12 (MISCELLANEOUS) ×2 IMPLANT
SPLINT CAST 1 STEP 4X30 (MISCELLANEOUS) ×2 IMPLANT
SUT ETHILON 4-0 (SUTURE) ×1
SUT ETHILON 4-0 FS2 18XMFL BLK (SUTURE) ×1
SUT VICRYL 3-0 27IN (SUTURE) ×2 IMPLANT
SUTURE ETHLN 4-0 FS2 18XMF BLK (SUTURE) ×2 IMPLANT

## 2023-05-31 NOTE — Op Note (Signed)
05/31/2023  1:46 PM  PATIENT:  Heidi Davenport  82 y.o. female  PRE-OPERATIVE DIAGNOSIS:  Closed Colles' fracture of right radius, initial encounter S52.531A  POST-OPERATIVE DIAGNOSIS:  * No post-op diagnosis entered *  PROCEDURE:  Procedure(s): OPEN REDUCTION INTERNAL FIXATION (ORIF) DISTAL RADIUS FRACTURE (Right)  SURGEON: Leitha Schuller, MD  ASSISTANTS: None  ANESTHESIA:   general  EBL:  Total I/O In: -  Out: 5 [Blood:5]  BLOOD ADMINISTERED:none  DRAINS: none   LOCAL MEDICATIONS USED:  OTHER block given by anesthesia preop  SPECIMEN:  No Specimen  DISPOSITION OF SPECIMEN:  N/A  COUNTS:  YES  TOURNIQUET:   Total Tourniquet Time Documented: Forearm (Right) - 22 minutes Total: Forearm (Right) - 22 minutes   IMPLANTS: Hand innovations short narrow DVR right with multiple screws and smooth pegs  DICTATION: .Dragon Dictation   patient was brought to the operating room and after the arm was prepped and draped in the usual sterile fashion after a regional block placed by anesthesia before coming back to the OR was obtained.   Tourniquet having been applied to the upper forearm.  Appropriate patient identification and timeout procedures were completed.  Tourniquet was raised and a volar approach was made after traction was applied at the end of the table through fingertrap traction.  Approach is center of the FCR tendon.  Tendon sheath incised and the tendon retracted radially protect the radial artery and associated veins.  The deep fascia was incised and the pronator was elevated off the distal and proximal fragments with exposure of the fracture site.  The fracture was extraarticular with multiple distal fragments and with traction applied and Freer elevator adequate alignment was obtained.  A short standard DVR plate applied with distal first technique using a K wire to hold the plate in position position of the plate was checked in both AP and lateral projections.  When  acceptable position was obtained the smooth pegs were placed drilling measuring and placing the smooth pegs in the distal fragments following this the shaft portion of the plate was brought down to the bone and 3 cortical screws were sequentially inserted they gave an essentially anatomic alignment in both AP and lateral projections.  Traction was removed and under fluoroscopic views there is no motion of the fracture.   The wound irrigated with tourniquet let down.  Wound was then closed with 3-0 Vicryl for the deep tissue and 4-0 nylon in a simple interrupted manner..  Dressing of 4 x 4's web roll and volar splint followed by Ace wrap applied.   PLAN OF CARE: Discharge to home after PACU  PATIENT DISPOSITION:  PACU - hemodynamically stable.

## 2023-05-31 NOTE — Discharge Instructions (Signed)
Keep arm elevated is much as you can Work on finger motion all you can trying to make a fist and fully straighten your fingers Ice to the back of the hand today and tomorrow may help with pain and swelling Keep splint clean and dry Pain medicine and nausea medicine as directed Call office if you are having problems  [phone] 

## 2023-05-31 NOTE — Transfer of Care (Signed)
Immediate Anesthesia Transfer of Care Note  Patient: Heidi Davenport  Procedure(s) Performed: OPEN REDUCTION INTERNAL FIXATION (ORIF) DISTAL RADIUS FRACTURE (Right: Arm Lower)  Patient Location: PACU  Anesthesia Type: General ETT  Level of Consciousness: awake, alert  and patient cooperative  Airway and Oxygen Therapy: Patient Spontanous Breathing and Patient connected to supplemental oxygen  Post-op Assessment: Post-op Vital signs reviewed, Patient's Cardiovascular Status Stable, Respiratory Function Stable, Patent Airway and No signs of Nausea or vomiting  Post-op Vital Signs: Reviewed and stable  Complications: No notable events documented.

## 2023-05-31 NOTE — Anesthesia Procedure Notes (Signed)
Anesthesia Regional Block: Supraclavicular block   Pre-Anesthetic Checklist: , timeout performed,  Correct Patient, Correct Site, Correct Laterality,  Correct Procedure, Correct Position, site marked,  Risks and benefits discussed,  Surgical consent,  Pre-op evaluation,  At surgeon's request and post-op pain management  Laterality: Right  Prep: chloraprep       Needles:  Injection technique: Single-shot  Needle Type: Echogenic Stimulator Needle      Needle Gauge: 21     Additional Needles:   Procedures:,,,, ultrasound used (permanent image in chart),,    Narrative:  Start time: 05/31/2023 12:35 PM End time: 05/31/2023 1:37 PM Injection made incrementally with aspirations every 5 mL.  Performed by: Personally  Anesthesiologist: Marisue Humble, MD  Additional Notes: Functioning IV was confirmed and monitors applied.  Sterile prep and drape,hand hygiene and sterile gloves were used.Ultrasound guidance: relevant anatomy identified, needle position confirmed, local anesthetic spread visualized around nerve(s)., vascular puncture avoided.  Image printed for medical record.  Negative aspiration and negative test dose prior to incremental administration of local anesthetic. The patient tolerated the procedure well. Vitals signes recorded in RN notes. Also intercostobrachial

## 2023-05-31 NOTE — Progress Notes (Signed)
Assisted [place] ANMD with right, interscalene , ultrasound guided block. Side rails up, monitors on throughout procedure. See vital signs in flow sheet. Tolerated Procedure well. 

## 2023-06-01 ENCOUNTER — Encounter: Payer: Self-pay | Admitting: Orthopedic Surgery

## 2023-06-01 NOTE — Anesthesia Postprocedure Evaluation (Signed)
Anesthesia Post Note  Patient: Heidi Davenport  Procedure(s) Performed: OPEN REDUCTION INTERNAL FIXATION (ORIF) DISTAL RADIUS FRACTURE (Right: Arm Lower)  Patient location during evaluation: PACU Anesthesia Type: General Level of consciousness: awake and alert Pain management: pain level controlled Vital Signs Assessment: post-procedure vital signs reviewed and stable Respiratory status: spontaneous breathing, nonlabored ventilation, respiratory function stable and patient connected to nasal cannula oxygen Cardiovascular status: blood pressure returned to baseline and stable Postop Assessment: no apparent nausea or vomiting Anesthetic complications: no   No notable events documented.   Last Vitals:  Vitals:   05/31/23 1415 05/31/23 1430  BP: 100/65 125/77  Pulse: 84 82  Resp: 17 (!) 23  Temp:  (!) 36.4 C  SpO2: 97% 96%    Last Pain:  Vitals:   05/31/23 1430  TempSrc:   PainSc: 0-No pain                 Niels Cranshaw C Marylu Dudenhoeffer

## 2023-06-03 DIAGNOSIS — Z8781 Personal history of (healed) traumatic fracture: Secondary | ICD-10-CM | POA: Diagnosis not present

## 2023-06-03 DIAGNOSIS — Z9889 Other specified postprocedural states: Secondary | ICD-10-CM | POA: Diagnosis not present

## 2023-06-03 DIAGNOSIS — S52531D Colles' fracture of right radius, subsequent encounter for closed fracture with routine healing: Secondary | ICD-10-CM | POA: Diagnosis not present

## 2023-06-14 DIAGNOSIS — S52531D Colles' fracture of right radius, subsequent encounter for closed fracture with routine healing: Secondary | ICD-10-CM | POA: Diagnosis not present

## 2023-06-27 DIAGNOSIS — S52531D Colles' fracture of right radius, subsequent encounter for closed fracture with routine healing: Secondary | ICD-10-CM | POA: Diagnosis not present

## 2023-07-11 DIAGNOSIS — S52531D Colles' fracture of right radius, subsequent encounter for closed fracture with routine healing: Secondary | ICD-10-CM | POA: Diagnosis not present

## 2023-08-09 IMAGING — MG MM DIGITAL SCREENING BILAT W/ TOMO AND CAD
6 of 10 series · 6 of 30 positions shown · non-contrast
Comparison: Previous exam(s).

CLINICAL DATA: Screening.

EXAM:
DIGITAL SCREENING BILATERAL MAMMOGRAM WITH TOMOSYNTHESIS AND CAD
TECHNIQUE: Bilateral screening digital craniocaudal and mediolateral oblique
mammograms were obtained. Bilateral screening digital breast
tomosynthesis was performed. The images were evaluated with
computer-aided detection.

[L MLO synth-2D]
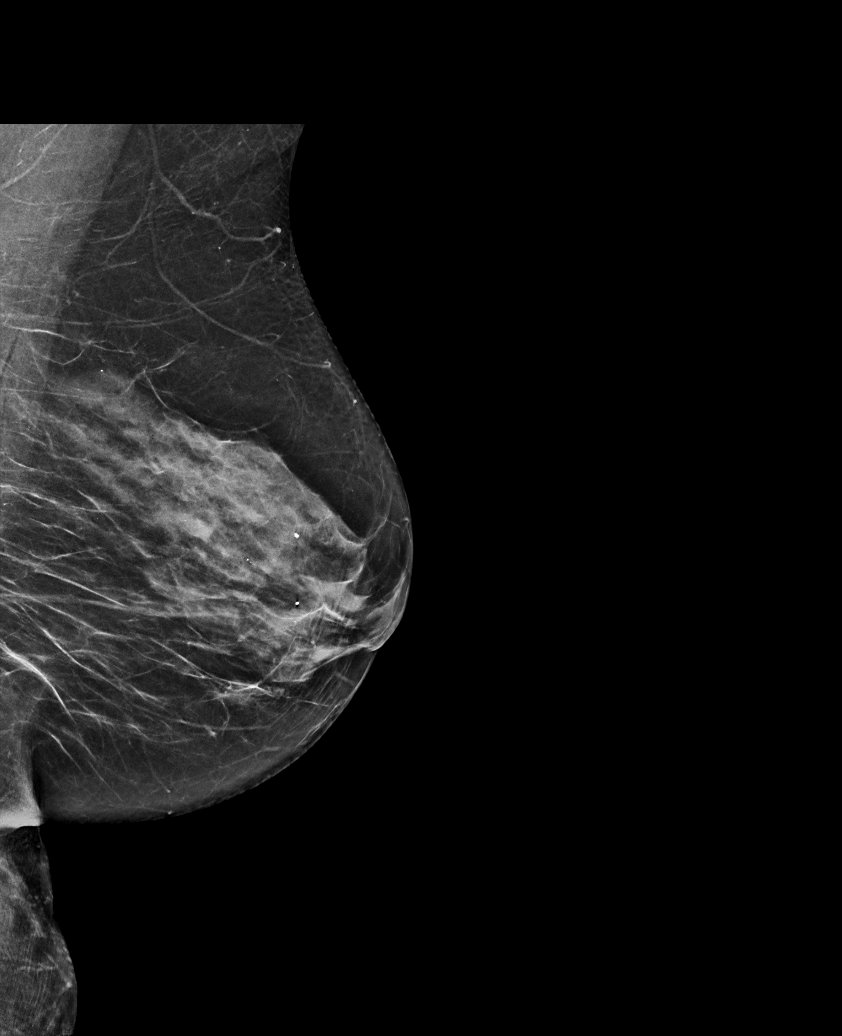

[L CC synth-2D]
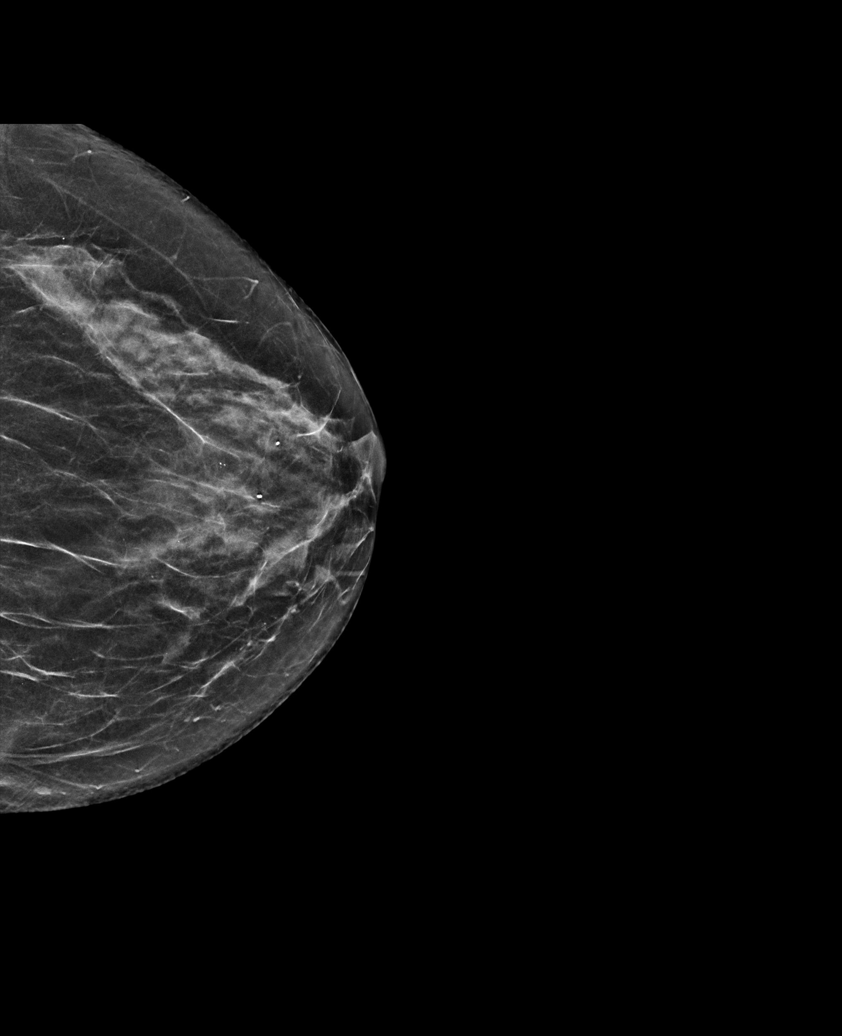

[R CC synth-2D (1 of 2)]
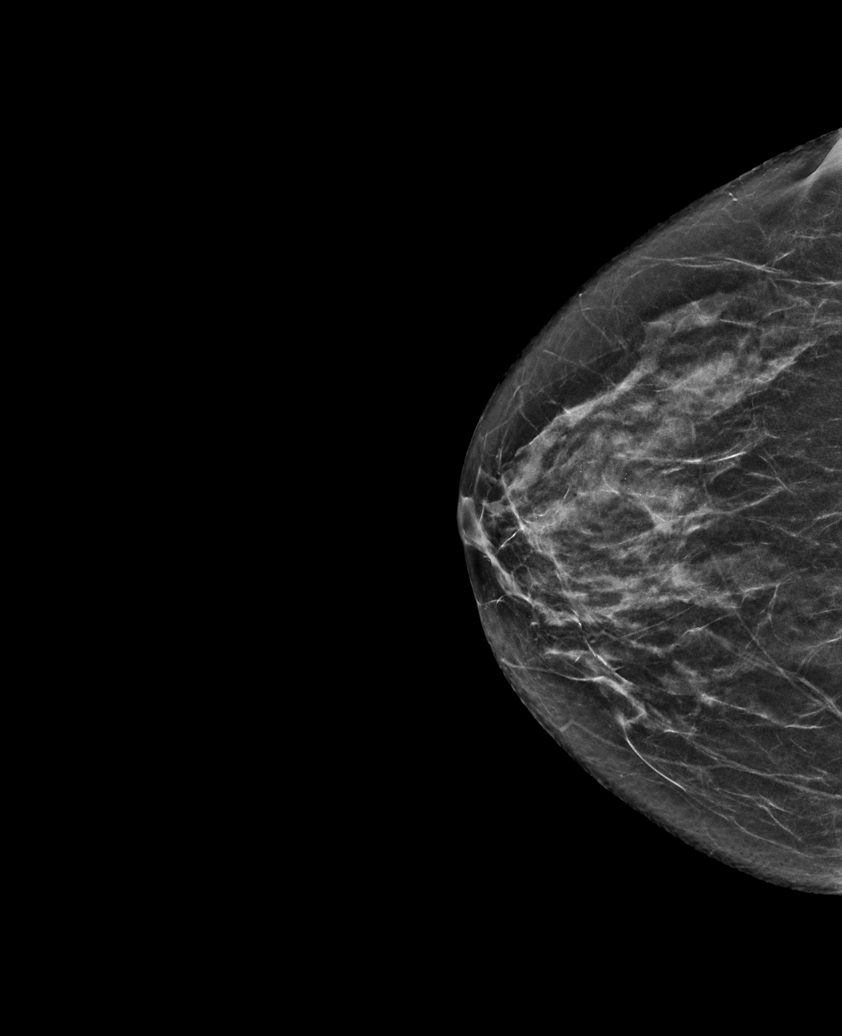

[R CC synth-2D (2 of 2)]
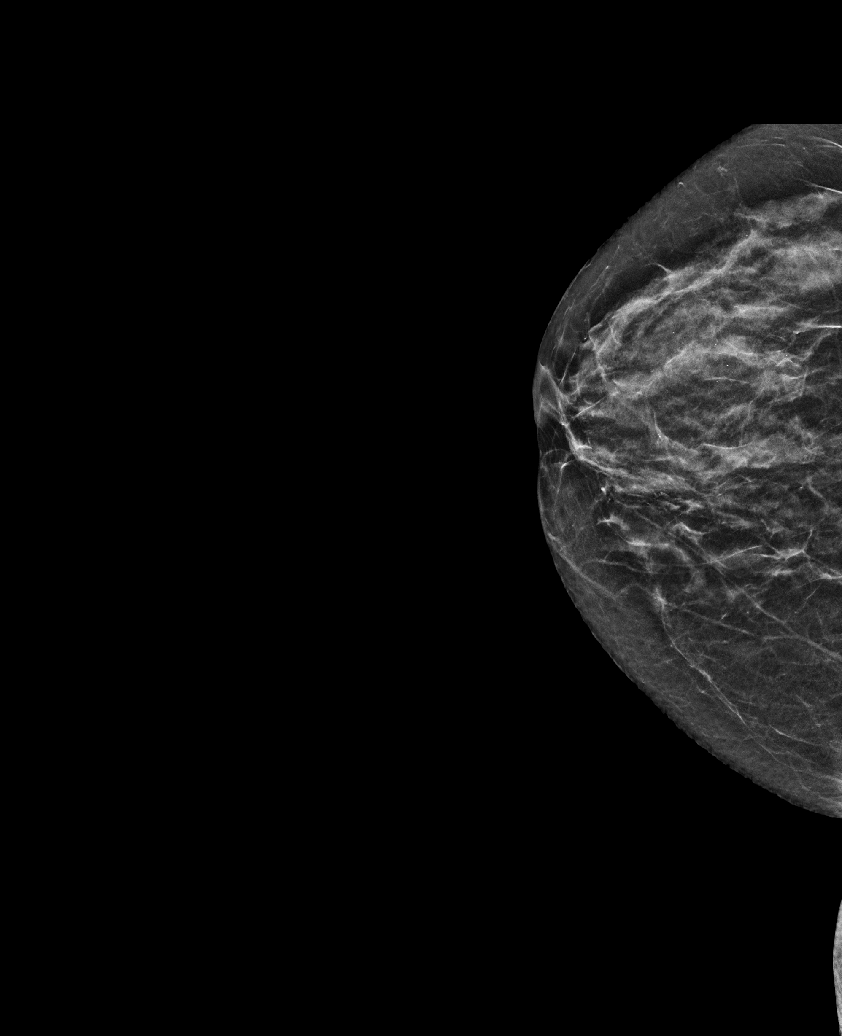

[R MLO synth-2D]
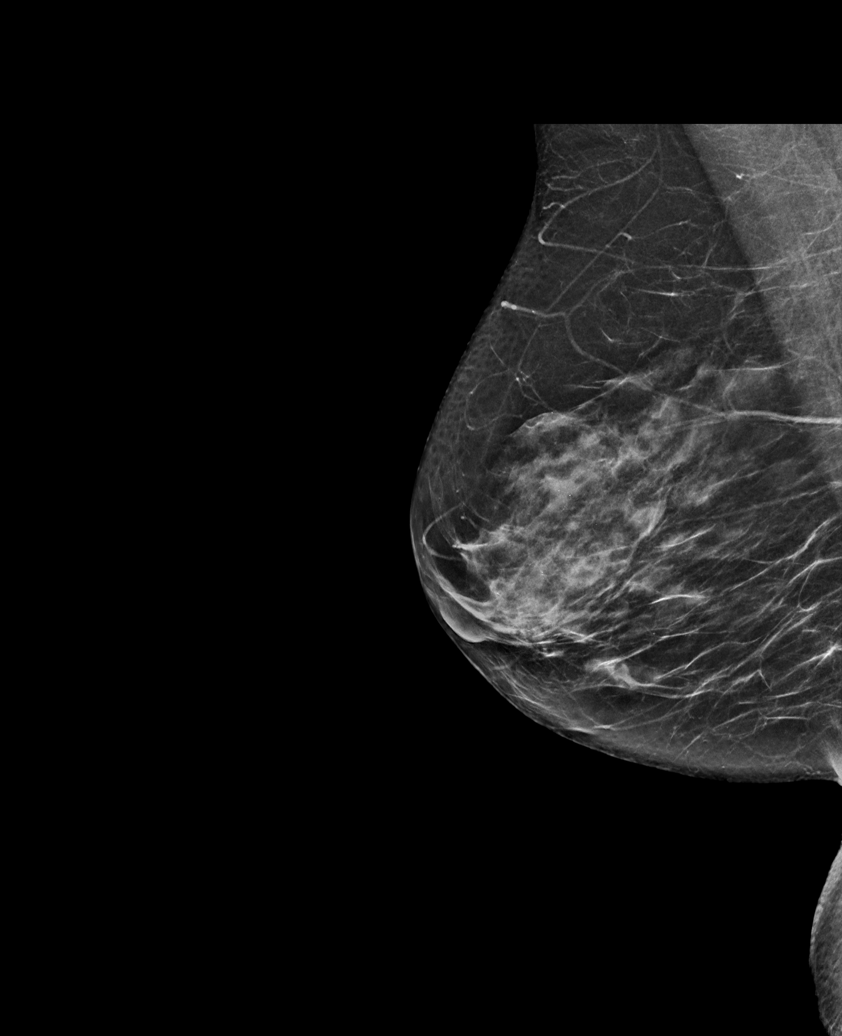

[L CC tomo · tomo slice 29/57.0]
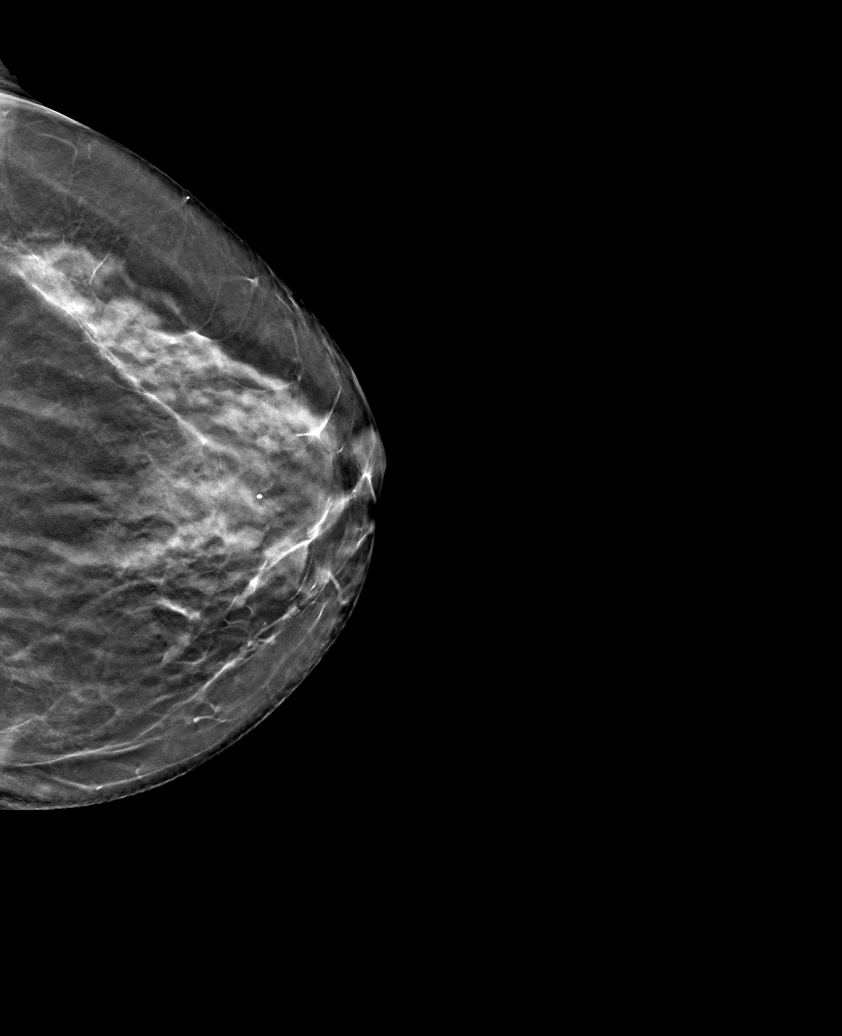

[6 of 30 positions shown; findings below may reference images not displayed]

ACR Breast Density Category c: The breast tissue is heterogeneously
dense, which may obscure small masses.
FINDINGS: There are no findings suspicious for malignancy.
IMPRESSION: No mammographic evidence of malignancy. A result letter of this
screening mammogram will be mailed directly to the patient.

RECOMMENDATION:
Screening mammogram in one year. (Code:[id number])

BI-RADS CATEGORY  1: Negative.

## 2023-09-09 DIAGNOSIS — Z008 Encounter for other general examination: Secondary | ICD-10-CM | POA: Diagnosis not present

## 2023-09-15 DIAGNOSIS — M810 Age-related osteoporosis without current pathological fracture: Secondary | ICD-10-CM | POA: Diagnosis not present

## 2023-09-15 DIAGNOSIS — R7301 Impaired fasting glucose: Secondary | ICD-10-CM | POA: Diagnosis not present

## 2023-09-15 DIAGNOSIS — I10 Essential (primary) hypertension: Secondary | ICD-10-CM | POA: Diagnosis not present

## 2023-09-15 DIAGNOSIS — R7989 Other specified abnormal findings of blood chemistry: Secondary | ICD-10-CM | POA: Diagnosis not present

## 2023-09-15 DIAGNOSIS — E785 Hyperlipidemia, unspecified: Secondary | ICD-10-CM | POA: Diagnosis not present

## 2023-10-11 DIAGNOSIS — D849 Immunodeficiency, unspecified: Secondary | ICD-10-CM | POA: Diagnosis not present

## 2023-10-11 DIAGNOSIS — I77810 Thoracic aortic ectasia: Secondary | ICD-10-CM | POA: Diagnosis not present

## 2023-10-11 DIAGNOSIS — L4050 Arthropathic psoriasis, unspecified: Secondary | ICD-10-CM | POA: Diagnosis not present

## 2023-10-11 DIAGNOSIS — M810 Age-related osteoporosis without current pathological fracture: Secondary | ICD-10-CM | POA: Diagnosis not present

## 2023-10-11 DIAGNOSIS — R7401 Elevation of levels of liver transaminase levels: Secondary | ICD-10-CM | POA: Diagnosis not present

## 2023-10-11 DIAGNOSIS — Z0000 Encounter for general adult medical examination without abnormal findings: Secondary | ICD-10-CM | POA: Diagnosis not present

## 2023-10-11 DIAGNOSIS — I10 Essential (primary) hypertension: Secondary | ICD-10-CM | POA: Diagnosis not present

## 2023-10-11 DIAGNOSIS — E785 Hyperlipidemia, unspecified: Secondary | ICD-10-CM | POA: Diagnosis not present

## 2023-10-11 DIAGNOSIS — R053 Chronic cough: Secondary | ICD-10-CM | POA: Diagnosis not present

## 2023-10-11 DIAGNOSIS — Z1331 Encounter for screening for depression: Secondary | ICD-10-CM | POA: Diagnosis not present

## 2023-10-11 DIAGNOSIS — R7301 Impaired fasting glucose: Secondary | ICD-10-CM | POA: Diagnosis not present

## 2023-10-11 DIAGNOSIS — Z1339 Encounter for screening examination for other mental health and behavioral disorders: Secondary | ICD-10-CM | POA: Diagnosis not present

## 2023-10-11 DIAGNOSIS — R82998 Other abnormal findings in urine: Secondary | ICD-10-CM | POA: Diagnosis not present

## 2023-10-11 DIAGNOSIS — F3341 Major depressive disorder, recurrent, in partial remission: Secondary | ICD-10-CM | POA: Diagnosis not present

## 2023-11-07 DIAGNOSIS — L400 Psoriasis vulgaris: Secondary | ICD-10-CM | POA: Diagnosis not present

## 2023-11-07 DIAGNOSIS — M25561 Pain in right knee: Secondary | ICD-10-CM | POA: Diagnosis not present

## 2023-11-07 DIAGNOSIS — M1991 Primary osteoarthritis, unspecified site: Secondary | ICD-10-CM | POA: Diagnosis not present

## 2023-11-07 DIAGNOSIS — Z6826 Body mass index (BMI) 26.0-26.9, adult: Secondary | ICD-10-CM | POA: Diagnosis not present

## 2023-11-07 DIAGNOSIS — E663 Overweight: Secondary | ICD-10-CM | POA: Diagnosis not present

## 2023-11-07 DIAGNOSIS — L4059 Other psoriatic arthropathy: Secondary | ICD-10-CM | POA: Diagnosis not present

## 2023-11-07 DIAGNOSIS — M5136 Other intervertebral disc degeneration, lumbar region with discogenic back pain only: Secondary | ICD-10-CM | POA: Diagnosis not present

## 2023-11-07 DIAGNOSIS — M25562 Pain in left knee: Secondary | ICD-10-CM | POA: Diagnosis not present

## 2023-11-07 DIAGNOSIS — M810 Age-related osteoporosis without current pathological fracture: Secondary | ICD-10-CM | POA: Diagnosis not present

## 2023-11-22 DIAGNOSIS — M810 Age-related osteoporosis without current pathological fracture: Secondary | ICD-10-CM | POA: Diagnosis not present

## 2023-11-22 DIAGNOSIS — I10 Essential (primary) hypertension: Secondary | ICD-10-CM | POA: Diagnosis not present

## 2023-11-30 ENCOUNTER — Other Ambulatory Visit (HOSPITAL_COMMUNITY): Payer: Self-pay | Admitting: *Deleted

## 2023-12-01 ENCOUNTER — Ambulatory Visit (HOSPITAL_COMMUNITY)
Admission: RE | Admit: 2023-12-01 | Discharge: 2023-12-01 | Disposition: A | Discharge status: Home / Self Care | Payer: Medicare HMO | Source: Ambulatory Visit | Attending: Internal Medicine | Admitting: Internal Medicine

## 2023-12-01 ENCOUNTER — Other Ambulatory Visit: Payer: Self-pay | Admitting: Internal Medicine

## 2023-12-01 DIAGNOSIS — M810 Age-related osteoporosis without current pathological fracture: Secondary | ICD-10-CM | POA: Diagnosis not present

## 2023-12-01 DIAGNOSIS — Z1231 Encounter for screening mammogram for malignant neoplasm of breast: Secondary | ICD-10-CM

## 2023-12-01 MED ORDER — DENOSUMAB 60 MG/ML ~~LOC~~ SOSY
60.0000 mg | PREFILLED_SYRINGE | Freq: Once | SUBCUTANEOUS | Status: AC
  Administered 2023-12-01: 60 mg via SUBCUTANEOUS

## 2023-12-01 MED ORDER — DENOSUMAB 60 MG/ML ~~LOC~~ SOSY
PREFILLED_SYRINGE | SUBCUTANEOUS | Status: AC
  Filled 2023-12-01: qty 1

## 2023-12-07 DIAGNOSIS — H524 Presbyopia: Secondary | ICD-10-CM | POA: Diagnosis not present

## 2023-12-07 DIAGNOSIS — Z961 Presence of intraocular lens: Secondary | ICD-10-CM | POA: Diagnosis not present

## 2023-12-07 DIAGNOSIS — H353131 Nonexudative age-related macular degeneration, bilateral, early dry stage: Secondary | ICD-10-CM | POA: Diagnosis not present

## 2024-01-03 ENCOUNTER — Ambulatory Visit
Admission: RE | Admit: 2024-01-03 | Discharge: 2024-01-03 | Disposition: A | Discharge status: Home / Self Care | Payer: Medicare HMO | Source: Ambulatory Visit | Attending: Internal Medicine | Admitting: Internal Medicine

## 2024-01-03 DIAGNOSIS — Z1231 Encounter for screening mammogram for malignant neoplasm of breast: Secondary | ICD-10-CM | POA: Insufficient documentation

## 2024-02-02 DIAGNOSIS — E663 Overweight: Secondary | ICD-10-CM | POA: Diagnosis not present

## 2024-02-02 DIAGNOSIS — M810 Age-related osteoporosis without current pathological fracture: Secondary | ICD-10-CM | POA: Diagnosis not present

## 2024-02-02 DIAGNOSIS — L4059 Other psoriatic arthropathy: Secondary | ICD-10-CM | POA: Diagnosis not present

## 2024-02-02 DIAGNOSIS — Z6826 Body mass index (BMI) 26.0-26.9, adult: Secondary | ICD-10-CM | POA: Diagnosis not present

## 2024-02-02 DIAGNOSIS — M5136 Other intervertebral disc degeneration, lumbar region with discogenic back pain only: Secondary | ICD-10-CM | POA: Diagnosis not present

## 2024-02-02 DIAGNOSIS — M25561 Pain in right knee: Secondary | ICD-10-CM | POA: Diagnosis not present

## 2024-02-02 DIAGNOSIS — L400 Psoriasis vulgaris: Secondary | ICD-10-CM | POA: Diagnosis not present

## 2024-02-02 DIAGNOSIS — M1991 Primary osteoarthritis, unspecified site: Secondary | ICD-10-CM | POA: Diagnosis not present

## 2024-05-31 ENCOUNTER — Telehealth: Payer: Self-pay

## 2024-05-31 ENCOUNTER — Other Ambulatory Visit (HOSPITAL_COMMUNITY): Payer: Self-pay | Admitting: *Deleted

## 2024-05-31 NOTE — Telephone Encounter (Addendum)
 Auth Submission: APPROVED Site of care: Site of care: [facility] Payer: [employer] medicare Medication & CPT/J Code(s) submitted: Prolia  (Denosumab ) [id number] Route of submission (phone, fax, portal):  Phone # Fax # Auth type: Buy/Bill PB Units/visits requested: 60mg  x 2 doses Reference number: [id number] Approval from: 11/22/23 to 11/21/24

## 2024-06-01 ENCOUNTER — Ambulatory Visit (HOSPITAL_COMMUNITY)
Admission: RE | Admit: 2024-06-01 | Discharge: 2024-06-01 | Disposition: A | Discharge status: Home / Self Care | Source: Ambulatory Visit | Attending: Internal Medicine | Admitting: Internal Medicine

## 2024-06-01 DIAGNOSIS — M810 Age-related osteoporosis without current pathological fracture: Secondary | ICD-10-CM | POA: Insufficient documentation

## 2024-06-01 MED ORDER — DENOSUMAB 60 MG/ML ~~LOC~~ SOSY
60.0000 mg | PREFILLED_SYRINGE | Freq: Once | SUBCUTANEOUS | Status: AC
  Administered 2024-06-01: 60 mg via SUBCUTANEOUS

## 2024-06-01 MED ORDER — DENOSUMAB 60 MG/ML ~~LOC~~ SOSY
PREFILLED_SYRINGE | SUBCUTANEOUS | Status: AC
  Filled 2024-06-01: qty 1

## 2024-07-17 DIAGNOSIS — M25561 Pain in right knee: Secondary | ICD-10-CM | POA: Diagnosis not present

## 2024-07-17 DIAGNOSIS — Z6825 Body mass index (BMI) 25.0-25.9, adult: Secondary | ICD-10-CM | POA: Diagnosis not present

## 2024-07-17 DIAGNOSIS — L400 Psoriasis vulgaris: Secondary | ICD-10-CM | POA: Diagnosis not present

## 2024-07-17 DIAGNOSIS — M1991 Primary osteoarthritis, unspecified site: Secondary | ICD-10-CM | POA: Diagnosis not present

## 2024-07-17 DIAGNOSIS — M810 Age-related osteoporosis without current pathological fracture: Secondary | ICD-10-CM | POA: Diagnosis not present

## 2024-07-17 DIAGNOSIS — E663 Overweight: Secondary | ICD-10-CM | POA: Diagnosis not present

## 2024-07-17 DIAGNOSIS — L4059 Other psoriatic arthropathy: Secondary | ICD-10-CM | POA: Diagnosis not present

## 2024-07-17 DIAGNOSIS — M25562 Pain in left knee: Secondary | ICD-10-CM | POA: Diagnosis not present

## 2024-07-17 DIAGNOSIS — M5136 Other intervertebral disc degeneration, lumbar region with discogenic back pain only: Secondary | ICD-10-CM | POA: Diagnosis not present

## 2024-07-27 ENCOUNTER — Emergency Department
Admission: EM | Admit: 2024-07-27 | Discharge: 2024-07-27 | Disposition: A | Discharge status: Home / Self Care | Attending: Emergency Medicine | Admitting: Emergency Medicine

## 2024-07-27 ENCOUNTER — Emergency Department

## 2024-07-27 ENCOUNTER — Other Ambulatory Visit: Payer: Self-pay

## 2024-07-27 DIAGNOSIS — M47812 Spondylosis without myelopathy or radiculopathy, cervical region: Secondary | ICD-10-CM | POA: Diagnosis not present

## 2024-07-27 DIAGNOSIS — S0990XA Unspecified injury of head, initial encounter: Secondary | ICD-10-CM | POA: Diagnosis present

## 2024-07-27 DIAGNOSIS — S0121XA Laceration without foreign body of nose, initial encounter: Secondary | ICD-10-CM | POA: Insufficient documentation

## 2024-07-27 DIAGNOSIS — S0992XA Unspecified injury of nose, initial encounter: Secondary | ICD-10-CM | POA: Diagnosis present

## 2024-07-27 DIAGNOSIS — W01198A Fall on same level from slipping, tripping and stumbling with subsequent striking against other object, initial encounter: Secondary | ICD-10-CM | POA: Diagnosis not present

## 2024-07-27 DIAGNOSIS — W19XXXA Unspecified fall, initial encounter: Secondary | ICD-10-CM

## 2024-07-27 DIAGNOSIS — R220 Localized swelling, mass and lump, head: Secondary | ICD-10-CM | POA: Diagnosis not present

## 2024-07-27 DIAGNOSIS — Z23 Encounter for immunization: Secondary | ICD-10-CM | POA: Diagnosis not present

## 2024-07-27 DIAGNOSIS — Y92009 Unspecified place in unspecified non-institutional (private) residence as the place of occurrence of the external cause: Secondary | ICD-10-CM | POA: Insufficient documentation

## 2024-07-27 DIAGNOSIS — S0181XA Laceration without foreign body of other part of head, initial encounter: Secondary | ICD-10-CM | POA: Diagnosis not present

## 2024-07-27 DIAGNOSIS — Z043 Encounter for examination and observation following other accident: Secondary | ICD-10-CM | POA: Diagnosis not present

## 2024-07-27 MED ORDER — TETANUS-DIPHTH-ACELL PERTUSSIS 5-2.5-18.5 LF-MCG/0.5 IM SUSY
0.5000 mL | PREFILLED_SYRINGE | Freq: Once | INTRAMUSCULAR | Status: AC
  Administered 2024-07-27: 0.5 mL via INTRAMUSCULAR
  Filled 2024-07-27 (×2): qty 0.5

## 2024-07-27 MED ORDER — ACETAMINOPHEN 500 MG PO TABS
1000.0000 mg | ORAL_TABLET | Freq: Once | ORAL | Status: AC
  Administered 2024-07-27: 1000 mg via ORAL
  Filled 2024-07-27: qty 2

## 2024-07-27 MED ORDER — LIDOCAINE HCL (PF) 1 % IJ SOLN
5.0000 mL | Freq: Once | INTRAMUSCULAR | Status: AC
  Administered 2024-07-27: 5 mL
  Filled 2024-07-27: qty 5

## 2024-07-27 NOTE — ED Provider Triage Note (Signed)
 Emergency Medicine Provider Triage Evaluation Note  Heidi Davenport , a 83 y.o. female  was evaluated in triage.  Pt complains of facial laceration after falling and hitting the glider. No current anticoagulants.   Physical Exam  There were no vitals taken for this visit. Gen:   Awake, no distress   Resp:  Normal effort  MSK:   Moves extremities without difficulty  Other:  Laceration to bridge of nose. Bleeding controlled  Medical Decision Making  Medically screening exam initiated at 3:06 PM.  Appropriate orders placed.  ONEDA DUFFETT was informed that the remainder of the evaluation will be completed by another provider, this initial triage assessment does not replace that evaluation, and the importance of remaining in the ED until their evaluation is complete.  MERLINDA Herlinda Kirk KATHEE, FNP 07/28/24 1931

## 2024-07-27 NOTE — ED Triage Notes (Signed)
 Pt sts that she fell and hit her forehead/ nose on the edge of a glider at home. Bleeding controlled at this time. Pt denies any blood thinners at this time.

## 2024-07-27 NOTE — ED Provider Notes (Signed)
 [facility] Provider Note    Event Date/Time   First MD Initiated Contact with Patient 07/27/24 2110     (approximate)   History   Fall   HPI  Heidi Davenport is a 83 y.o. female with hyperlipidemia who comes in with concerns for a fall.  Patient reports a mechanical fall where she was leaning over and lost her balance and hit the edge of her forehead on the glider at home.  She denies any LOC, no chest pain no shortness of breath no blood thinners.  She has laceration to above her nose which is what prompted her to come in.  Denies any vision changes.     Physical Exam   Triage Vital Signs: ED Triage Vitals  Encounter Vitals Group     BP 07/27/24 1506 125/84     Girls Systolic BP Percentile --      Girls Diastolic BP Percentile --      Boys Systolic BP Percentile --      Boys Diastolic BP Percentile --      Pulse Rate 07/27/24 1506 84     Resp 07/27/24 1506 18     Temp 07/27/24 1506 98.4 F (36.9 C)     Temp Source 07/27/24 1506 Oral     SpO2 07/27/24 1506 98 %     Weight 07/27/24 1511 160 lb (72.6 kg)     Height 07/27/24 1511 5' 6 (1.676 m)     Head Circumference --      Peak Flow --      Pain Score 07/27/24 1511 4     Pain Loc --      Pain Education --      Exclude from Growth Chart --     Most recent vital signs: Vitals:   07/27/24 1506  BP: 125/84  Pulse: 84  Resp: 18  Temp: 98.4 F (36.9 C)  SpO2: 98%     General: Awake, no distress.  CV:  Good peripheral perfusion.  Resp:  Normal effort.  Abd:  No distention.  Other:  Patient with V shaped laceration to the bridge of the nose.  Some bruising noted with a little bit of discoloration of the tip of the laceration that could be bruising versus poor blood flow to the skin.  Extraocular movements are intact pupils are reactive bilaterally.  Full range of motion of all of her other extremities.  Able lift both legs up off the bed no chest wall tenderness no abdominal  tenderness   ED Results / Procedures / Treatments   Labs (all labs ordered are listed, but only abnormal results are displayed) Labs Reviewed - No data to display   RADIOLOGY I have reviewed the ct personally and interpreted no evidence of intracranial hemorrhage   PROCEDURES:  Critical Care performed: No  .Laceration Repair  Date/Time: 07/27/2024 11:02 PM  Performed by: Ernest Ronal BRAVO, MD Authorized by: Ernest Ronal BRAVO, MD   Consent:    Consent obtained:  Verbal   Consent given by:  Patient   Risks discussed:  Pain, retained foreign body, tendon damage, poor cosmetic result, vascular damage, poor wound healing, nerve damage, need for additional repair and infection   Alternatives discussed:  No treatment Anesthesia:    Anesthesia method:  Local infiltration   Local anesthetic:  Lidocaine  1% w/o epi Laceration details:    Location:  Face   Face location:  Nose   Length (cm):  6  Depth (mm):  2 Pre-procedure details:    Preparation:  Patient was prepped and draped in usual sterile fashion Exploration:    Limited defect created (wound extended): no     Contaminated: no   Treatment:    Area cleansed with:  Saline and povidone-iodine   Irrigation solution:  Sterile water   Irrigation volume:  300cc   Irrigation method:  Pressure wash   Debridement:  None   Undermining:  None Skin repair:    Repair method:  Sutures   Suture material:  Prolene   Number of sutures:  9 Approximation:    Approximation:  Close Repair type:    Repair type:  Simple Post-procedure details:    Dressing:  Open (no dressing)   Procedure completion:  Tolerated well, no immediate complications    MEDICATIONS ORDERED IN ED: Medications  Tdap (BOOSTRIX) injection 0.5 mL (0.5 mLs Intramuscular Patient Refused/Not Given 07/27/24 2148)  lidocaine  (PF) (XYLOCAINE ) 1 % injection 5 mL (has no administration in time range)  acetaminophen  (TYLENOL ) tablet 1,000 mg (1,000 mg Oral Given 07/27/24 2133)      IMPRESSION / MDM / ASSESSMENT AND PLAN / ED COURSE  I reviewed the triage vital signs and the nursing notes.   Patient's presentation is most consistent with acute presentation with potential threat to life or bodily function.   CT imaging ordered evaluate for intercranial hemorrhage, cervical fracture, facial fracture these were all negative.  No evidence of septal hematoma on the inside the nose.  No eye involvement.  Does have a flap incision across her nose.  There is a little bit of discoloration of the tip but she also has some bruising around that area so not sure if this is from poor blood flow versus the bruising.  I did suture it back into place if I debrided it it would leave a gap over the bridge of her nose.  Discussed with family to keep an eye out for this area to ensure that no discoloration continued up the area they expressed understanding.  Will refer her to wound care for evaluation of the wound given the large flap size on the nose.  They understand to have sutures removed in 5 days.  Tdap updated Tylenol  given for pain.  CT imaging without any acute pathology.  She denies any weakness, chest pain, shortness of breath no indication for blood work, EKG and patient is agreeable to this as she denied any of the symptoms.      FINAL CLINICAL IMPRESSION(S) / ED DIAGNOSES   Final diagnoses:  Facial laceration, initial encounter  Fall, initial encounter     Rx / DC Orders   ED Discharge Orders          Ordered    AMB referral to wound care center        07/27/24 2211             Note:  This document was prepared using Dragon voice recognition software and may include unintentional dictation errors.   Ernest Ronal BRAVO, MD 07/27/24 [phone]

## 2024-07-27 NOTE — Discharge Instructions (Addendum)
 Return to the ER for fevers, worsening symptoms or any other concerns.  IMPRESSION: 1. Small laceration with soft tissue swelling extending from the right supraorbital region along the right nasal bridge. No underlying fracture. 2. No acute intracranial process.

## 2024-08-01 DIAGNOSIS — W19XXXA Unspecified fall, initial encounter: Secondary | ICD-10-CM | POA: Diagnosis not present

## 2024-08-01 DIAGNOSIS — R2681 Unsteadiness on feet: Secondary | ICD-10-CM | POA: Diagnosis not present

## 2024-08-01 DIAGNOSIS — I10 Essential (primary) hypertension: Secondary | ICD-10-CM | POA: Diagnosis not present

## 2024-08-01 DIAGNOSIS — S0121XA Laceration without foreign body of nose, initial encounter: Secondary | ICD-10-CM | POA: Diagnosis not present

## 2024-08-23 DIAGNOSIS — M25562 Pain in left knee: Secondary | ICD-10-CM | POA: Diagnosis not present

## 2024-08-23 DIAGNOSIS — M25511 Pain in right shoulder: Secondary | ICD-10-CM | POA: Diagnosis not present

## 2024-08-23 DIAGNOSIS — M5136 Other intervertebral disc degeneration, lumbar region with discogenic back pain only: Secondary | ICD-10-CM | POA: Diagnosis not present

## 2024-08-23 DIAGNOSIS — E663 Overweight: Secondary | ICD-10-CM | POA: Diagnosis not present

## 2024-08-23 DIAGNOSIS — M25561 Pain in right knee: Secondary | ICD-10-CM | POA: Diagnosis not present

## 2024-08-23 DIAGNOSIS — Z6825 Body mass index (BMI) 25.0-25.9, adult: Secondary | ICD-10-CM | POA: Diagnosis not present

## 2024-08-23 DIAGNOSIS — L400 Psoriasis vulgaris: Secondary | ICD-10-CM | POA: Diagnosis not present

## 2024-08-23 DIAGNOSIS — L4059 Other psoriatic arthropathy: Secondary | ICD-10-CM | POA: Diagnosis not present

## 2024-08-23 DIAGNOSIS — M1991 Primary osteoarthritis, unspecified site: Secondary | ICD-10-CM | POA: Diagnosis not present

## 2024-08-23 DIAGNOSIS — M810 Age-related osteoporosis without current pathological fracture: Secondary | ICD-10-CM | POA: Diagnosis not present

## 2024-08-28 DIAGNOSIS — S46911A Strain of unspecified muscle, fascia and tendon at shoulder and upper arm level, right arm, initial encounter: Secondary | ICD-10-CM | POA: Diagnosis not present

## 2024-09-07 DIAGNOSIS — S46011A Strain of muscle(s) and tendon(s) of the rotator cuff of right shoulder, initial encounter: Secondary | ICD-10-CM | POA: Diagnosis not present

## 2024-09-07 DIAGNOSIS — M25511 Pain in right shoulder: Secondary | ICD-10-CM | POA: Diagnosis not present

## 2024-10-10 DIAGNOSIS — Z1389 Encounter for screening for other disorder: Secondary | ICD-10-CM | POA: Diagnosis not present

## 2024-10-10 DIAGNOSIS — E7849 Other hyperlipidemia: Secondary | ICD-10-CM | POA: Diagnosis not present

## 2024-10-10 DIAGNOSIS — E559 Vitamin D deficiency, unspecified: Secondary | ICD-10-CM | POA: Diagnosis not present

## 2024-11-16 DIAGNOSIS — Z1331 Encounter for screening for depression: Secondary | ICD-10-CM | POA: Diagnosis not present

## 2024-11-16 DIAGNOSIS — R7301 Impaired fasting glucose: Secondary | ICD-10-CM | POA: Diagnosis not present

## 2024-11-16 DIAGNOSIS — R82998 Other abnormal findings in urine: Secondary | ICD-10-CM | POA: Diagnosis not present

## 2024-11-16 DIAGNOSIS — I77810 Thoracic aortic ectasia: Secondary | ICD-10-CM | POA: Diagnosis not present

## 2024-11-16 DIAGNOSIS — G2581 Restless legs syndrome: Secondary | ICD-10-CM | POA: Diagnosis not present

## 2024-11-16 DIAGNOSIS — G301 Alzheimer's disease with late onset: Secondary | ICD-10-CM | POA: Diagnosis not present

## 2024-11-16 DIAGNOSIS — Z1339 Encounter for screening examination for other mental health and behavioral disorders: Secondary | ICD-10-CM | POA: Diagnosis not present

## 2024-11-16 DIAGNOSIS — L4050 Arthropathic psoriasis, unspecified: Secondary | ICD-10-CM | POA: Diagnosis not present

## 2024-11-16 DIAGNOSIS — F3341 Major depressive disorder, recurrent, in partial remission: Secondary | ICD-10-CM | POA: Diagnosis not present

## 2024-11-16 DIAGNOSIS — Z0000 Encounter for general adult medical examination without abnormal findings: Secondary | ICD-10-CM | POA: Diagnosis not present

## 2024-11-16 DIAGNOSIS — K219 Gastro-esophageal reflux disease without esophagitis: Secondary | ICD-10-CM | POA: Diagnosis not present

## 2024-11-16 DIAGNOSIS — D849 Immunodeficiency, unspecified: Secondary | ICD-10-CM | POA: Diagnosis not present

## 2024-11-16 DIAGNOSIS — F02A0 Dementia in other diseases classified elsewhere, mild, without behavioral disturbance, psychotic disturbance, mood disturbance, and anxiety: Secondary | ICD-10-CM | POA: Diagnosis not present

## 2024-11-16 DIAGNOSIS — I10 Essential (primary) hypertension: Secondary | ICD-10-CM | POA: Diagnosis not present

## 2024-11-16 DIAGNOSIS — E785 Hyperlipidemia, unspecified: Secondary | ICD-10-CM | POA: Diagnosis not present

## 2024-11-16 DIAGNOSIS — M810 Age-related osteoporosis without current pathological fracture: Secondary | ICD-10-CM | POA: Diagnosis not present

## 2024-11-16 DIAGNOSIS — Z23 Encounter for immunization: Secondary | ICD-10-CM | POA: Diagnosis not present

## 2024-11-19 ENCOUNTER — Other Ambulatory Visit (HOSPITAL_COMMUNITY): Payer: Self-pay | Admitting: Internal Medicine

## 2024-11-20 ENCOUNTER — Telehealth (HOSPITAL_COMMUNITY): Payer: Self-pay

## 2024-11-20 NOTE — Telephone Encounter (Signed)
 Auth Submission: APPROVED Site of care: Site of care: [facility] Payer: [employer] Medicare Medication & CPT/J Code(s) submitted: Prolia  (Denosumab ) [id number] Diagnosis Code: M81.0 Route of submission (phone, fax, portal): fax Phone # Fax #[phone]  Auth type: Buy/Bill HB Units/visits requested: 60mg  q34months Reference number: [id number] Approval from: 11/20/24 to 11/20/25

## 2024-11-21 ENCOUNTER — Encounter: Payer: Self-pay | Admitting: Internal Medicine

## 2024-12-14 ENCOUNTER — Ambulatory Visit
Admission: RE | Admit: 2024-12-14 | Discharge: 2024-12-14 | Disposition: A | Source: Ambulatory Visit | Attending: Internal Medicine | Admitting: Internal Medicine

## 2024-12-14 VITALS — BP 148/87 | HR 81 | Temp 97.4°F | Resp 16

## 2024-12-14 DIAGNOSIS — M810 Age-related osteoporosis without current pathological fracture: Secondary | ICD-10-CM

## 2024-12-14 MED ORDER — DENOSUMAB 60 MG/ML ~~LOC~~ SOSY
60.0000 mg | PREFILLED_SYRINGE | Freq: Once | SUBCUTANEOUS | Status: DC
  Filled 2024-12-14: qty 1

## 2024-12-14 MED ORDER — DENOSUMAB-BNHT 60 MG/ML ~~LOC~~ SOSY
60.0000 mg | PREFILLED_SYRINGE | Freq: Once | SUBCUTANEOUS | Status: DC
  Filled 2024-12-14: qty 1

## 2024-12-25 ENCOUNTER — Ambulatory Visit: Admission: RE | Admit: 2024-12-25

## 2024-12-25 DIAGNOSIS — M810 Age-related osteoporosis without current pathological fracture: Secondary | ICD-10-CM

## 2025-01-30 ENCOUNTER — Ambulatory Visit
Admission: RE | Admit: 2025-01-30 | Disposition: A | Source: Ambulatory Visit | Attending: Internal Medicine | Admitting: Internal Medicine

## 2025-06-14 ENCOUNTER — Ambulatory Visit: Attending: Internal Medicine
